# Patient Record
Sex: Female | Born: 1960 | Race: White | Hispanic: No | Smoking: Former smoker
Health system: Southern US, Community
[De-identification: ages and names within clinical notes are randomized; demographics above are authoritative.]

## PROBLEM LIST (undated history)

## (undated) DIAGNOSIS — G8929 Other chronic pain: Secondary | ICD-10-CM

## (undated) DIAGNOSIS — F419 Anxiety disorder, unspecified: Secondary | ICD-10-CM

## (undated) DIAGNOSIS — M549 Dorsalgia, unspecified: Secondary | ICD-10-CM

## (undated) DIAGNOSIS — N179 Acute kidney failure, unspecified: Secondary | ICD-10-CM

## (undated) DIAGNOSIS — F1110 Opioid abuse, uncomplicated: Secondary | ICD-10-CM

## (undated) DIAGNOSIS — F329 Major depressive disorder, single episode, unspecified: Secondary | ICD-10-CM

## (undated) DIAGNOSIS — F32A Depression, unspecified: Secondary | ICD-10-CM

## (undated) DIAGNOSIS — M546 Pain in thoracic spine: Secondary | ICD-10-CM

## (undated) DIAGNOSIS — F319 Bipolar disorder, unspecified: Secondary | ICD-10-CM

## (undated) DIAGNOSIS — M5020 Other cervical disc displacement, unspecified cervical region: Secondary | ICD-10-CM

## (undated) DIAGNOSIS — A419 Sepsis, unspecified organism: Secondary | ICD-10-CM

## (undated) DIAGNOSIS — F1010 Alcohol abuse, uncomplicated: Secondary | ICD-10-CM

## (undated) HISTORY — PX: HEMORRHOID SURGERY: SHX153

## (undated) HISTORY — PX: TUBAL LIGATION: SHX77

## (undated) HISTORY — PX: LAPAROSCOPIC CHOLECYSTECTOMY: SUR755

## (undated) HISTORY — PX: ABDOMINAL HYSTERECTOMY: SHX81

## (undated) HISTORY — PX: TONSILLECTOMY: SUR1361

---

## 2013-09-25 ENCOUNTER — Encounter (HOSPITAL_BASED_OUTPATIENT_CLINIC_OR_DEPARTMENT_OTHER): Payer: Self-pay | Admitting: Emergency Medicine

## 2013-09-25 ENCOUNTER — Emergency Department (HOSPITAL_BASED_OUTPATIENT_CLINIC_OR_DEPARTMENT_OTHER)
Admission: EM | Admit: 2013-09-25 | Discharge: 2013-09-25 | Disposition: A | Discharge status: Home / Self Care | Payer: BC Managed Care – PPO | Attending: Emergency Medicine | Admitting: Emergency Medicine

## 2013-09-25 DIAGNOSIS — Z792 Long term (current) use of antibiotics: Secondary | ICD-10-CM | POA: Insufficient documentation

## 2013-09-25 DIAGNOSIS — F10920 Alcohol use, unspecified with intoxication, uncomplicated: Secondary | ICD-10-CM

## 2013-09-25 DIAGNOSIS — F17200 Nicotine dependence, unspecified, uncomplicated: Secondary | ICD-10-CM | POA: Insufficient documentation

## 2013-09-25 DIAGNOSIS — F1010 Alcohol abuse, uncomplicated: Secondary | ICD-10-CM | POA: Insufficient documentation

## 2013-09-25 DIAGNOSIS — IMO0002 Reserved for concepts with insufficient information to code with codable children: Secondary | ICD-10-CM | POA: Insufficient documentation

## 2013-09-25 LAB — RAPID URINE DRUG SCREEN, HOSP PERFORMED
Amphetamines: NOT DETECTED
Barbiturates: NOT DETECTED
Benzodiazepines: NOT DETECTED
Cocaine: NOT DETECTED
Opiates: NOT DETECTED
TETRAHYDROCANNABINOL: NOT DETECTED

## 2013-09-25 LAB — ETHANOL: ALCOHOL ETHYL (B): 231 mg/dL — AB (ref 0–11)

## 2013-09-25 NOTE — ED Notes (Signed)
Daughter states pt is slurring her speech-pt admits to ETOH use today-pt A/O-NAD

## 2013-09-25 NOTE — ED Notes (Signed)
Pt reports recovering ETOH and substance abuse relapsed x 3 weeks daughter with pt

## 2013-09-25 NOTE — Discharge Instructions (Signed)
Alcohol Intoxication °Alcohol intoxication occurs when you drink enough alcohol that it affects your ability to function. It can be mild or very severe. Drinking a lot of alcohol in a short time is called binge drinking. This can be very harmful. Drinking alcohol can also be more dangerous if you are taking medicines or other drugs. Some of the effects caused by alcohol may include: °· Loss of coordination. °· Changes in mood and behavior. °· Unclear thinking. °· Trouble talking (slurred speech). °· Throwing up (vomiting). °· Confusion. °· Slowed breathing. °· Twitching and shaking (seizures). °· Loss of consciousness. °HOME CARE °· Do not drive after drinking alcohol. °· Drink enough water and fluids to keep your pee (urine) clear or pale yellow. Avoid caffeine. °· Only take medicine as told by your doctor. °GET HELP IF: °· You throw up (vomit) many times. °· You do not feel better after a few days. °· You frequently have alcohol intoxication. Your doctor can help decide if you should see a substance use treatment counselor. °GET HELP RIGHT AWAY IF: °· You become shaky when you stop drinking. °· You have twitching and shaking. °· You throw up blood. It may look bright red or like coffee grounds. °· You notice blood in your poop (bowel movements). °· You become lightheaded or pass out (faint). °MAKE SURE YOU:  °· Understand these instructions. °· Will watch your condition. °· Will get help right away if you are not doing well or get worse. °Document Released: 08/03/2007 Document Revised: 10/17/2012 Document Reviewed: 07/20/2012 °[employer]® Patient Information ©2015 [employer]. This information is not intended to replace advice given to you by your health care provider. Make sure you discuss any questions you have with your health care provider. ° °

## 2013-09-25 NOTE — ED Provider Notes (Signed)
CSN: [id number]     Arrival date & time 09/25/13  2042 History   First MD Initiated Contact with Patient 09/25/13 2110     This chart was scribed for Junius ArgyleForrest S Mariellen Blaney, MD by Arlan OrganAshley Leger, ED Scribe. This patient was seen in room MH05/MH05 and the patient's care was started 9:16 PM.   Chief Complaint  Patient presents with  . Slurred speech    The history is provided by the patient and a relative. No language interpreter was used.    HPI Comments: Heather Hess is a 53 y.o. female with a PMHx of depression who presents to the Emergency Department for a ETOH and drug screen today. She admits to consuming about a half a pint of vodka today. She denies any illicit drug use. States "my life has been so bad in the last 2 weeks". States "I have been threatened to go to jail". 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 States she was taking about 8 pills daily at that time. She states alcohol abuse started shortly after her Father was killed. She is currently on Cymbalta for clinical depression. At this time she denies any fever, chills, SOB, or CP. Pt has been living with her daughter since October 2014. No known allergies to medications. No other concerns this visit.  History reviewed. No pertinent past medical history. History reviewed. No pertinent past surgical history. No family history on file. History  Substance Use Topics  . Smoking status: Current Every Day Smoker  . Smokeless tobacco: Not on file  . Alcohol Use: Yes   OB History   Grav Para Term Preterm Abortions TAB SAB Ect Mult Living                 Review of Systems  Constitutional: Negative for fever and chills.  HENT: Negative for rhinorrhea and sore throat.   Eyes: Negative for visual disturbance.  Respiratory: Negative for cough and shortness of breath.   Cardiovascular: Negative for chest pain and leg swelling.  Gastrointestinal: Negative for nausea, vomiting,  abdominal pain and diarrhea.  Genitourinary: Negative for dysuria.  Musculoskeletal: Negative for back pain and neck pain.  Skin: Negative for rash.  Neurological: Negative for dizziness, light-headedness and headaches.  Hematological: Does not bruise/bleed easily.  Psychiatric/Behavioral: Negative for confusion.      Allergies  Review of patient's allergies indicates no known allergies.  Home Medications   Prior to Admission medications   Medication Sig Start Date End Date Taking? Authorizing Provider  Azithromycin (ZITHROMAX PO) Take by mouth.   Yes Historical Provider, MD  fluticasone (FLONASE) 50 MCG/ACT nasal spray Place into both nostrils daily.   Yes Historical Provider, MD   Triage Vitals: BP 116/68  Pulse 98  Temp(Src) 98.3 F (36.8 C) (Oral)  Resp 18  Ht 5\' 8"  (1.727 m)  Wt 150 lb (68.04 kg)  BMI 22.81 kg/m2  SpO2 98%   Physical Exam  Nursing note and vitals reviewed. Constitutional: She is oriented to person, place, and time. She appears well-developed and well-nourished.  Alert and oriented x 3  HENT:  Head: Normocephalic and atraumatic.  Mouth/Throat: Oropharynx is clear and moist.  Eyes: Conjunctivae and EOM are normal. Pupils are equal, round, and reactive to light.  Neck: Normal range of motion. Neck supple.  Cardiovascular: Normal rate, regular rhythm and normal heart sounds.   Pulmonary/Chest: Effort normal and breath sounds normal.  Abdominal: Soft. Bowel sounds are  normal.  Musculoskeletal: Normal range of motion.  Neurological: She is alert and oriented to person, place, and time.  Pt appears mildly intoxicated, but appropriate and following commands.   Skin: Skin is warm and dry.  Psychiatric: She has a normal mood and affect. Her behavior is normal.    ED Course  Procedures (including critical care time)  DIAGNOSTIC STUDIES: Oxygen Saturation is 98% on RA, Normal by my interpretation.    COORDINATION OF CARE: 9:25 PM- Will order drug  screen and ethanol. Discussed treatment plan with pt at bedside and pt agreed to plan.     Labs Review Labs Reviewed  ETHANOL - Abnormal; Notable for the following:    Alcohol, Ethyl (B) 231 (*)    All other components within normal limits  URINE RAPID DRUG SCREEN (HOSP PERFORMED)    Imaging Review No results found.   EKG Interpretation None      MDM   Final diagnoses:  Alcohol intoxication, uncomplicated    10:50 PM 53 y.o. female who pw w/ mild etoh intoxication. Her daughter would like etoh and drug screen if she is to continue to let her mother live w/ her. The pt is mildly intoxicated, but a/o x3 and has mental capacity to make decisions. The pt would like the UDS and etoh drawn. She agreed to let me share this with her and her daughter.   10:50 PM: Pt continues to appear well, neg UDS, mildly elev etoh level. The daughter will take the pt home. The patient currently has a Veterinary surgeon and resources for drug/etoh addiction.  I have discussed the diagnosis/risks/treatment options with the patient and family and believe the pt to be eligible for discharge home to follow-up with pcp as needed. We also discussed returning to the ED immediately if new or worsening sx occur. Medications administered to the patient during their visit and any new prescriptions provided to the patient are listed below.  Medications given during this visit Medications - No data to display  New Prescriptions   No medications on file     I personally performed the services described in this documentation, which was scribed in my presence. The recorded information has been reviewed and is accurate.    Junius Argyle, MD 09/26/13 1520

## 2014-07-20 ENCOUNTER — Encounter (HOSPITAL_COMMUNITY): Payer: Self-pay | Admitting: *Deleted

## 2014-07-20 ENCOUNTER — Emergency Department (HOSPITAL_COMMUNITY)
Admission: EM | Admit: 2014-07-20 | Discharge: 2014-07-21 | Disposition: A | Discharge status: Other Acute Inpatient Hospital | Payer: Self-pay | Attending: Emergency Medicine | Admitting: Emergency Medicine

## 2014-07-20 DIAGNOSIS — F329 Major depressive disorder, single episode, unspecified: Secondary | ICD-10-CM | POA: Insufficient documentation

## 2014-07-20 DIAGNOSIS — F32A Depression, unspecified: Secondary | ICD-10-CM

## 2014-07-20 DIAGNOSIS — Z720 Tobacco use: Secondary | ICD-10-CM | POA: Insufficient documentation

## 2014-07-20 DIAGNOSIS — R45851 Suicidal ideations: Secondary | ICD-10-CM

## 2014-07-20 DIAGNOSIS — F10929 Alcohol use, unspecified with intoxication, unspecified: Secondary | ICD-10-CM

## 2014-07-20 DIAGNOSIS — F10129 Alcohol abuse with intoxication, unspecified: Secondary | ICD-10-CM | POA: Insufficient documentation

## 2014-07-20 DIAGNOSIS — Z7951 Long term (current) use of inhaled steroids: Secondary | ICD-10-CM | POA: Insufficient documentation

## 2014-07-20 DIAGNOSIS — Z79899 Other long term (current) drug therapy: Secondary | ICD-10-CM | POA: Insufficient documentation

## 2014-07-20 DIAGNOSIS — F332 Major depressive disorder, recurrent severe without psychotic features: Secondary | ICD-10-CM | POA: Diagnosis present

## 2014-07-20 HISTORY — DX: Other cervical disc displacement, unspecified cervical region: M50.20

## 2014-07-20 HISTORY — DX: Depression, unspecified: F32.A

## 2014-07-20 HISTORY — DX: Major depressive disorder, single episode, unspecified: F32.9

## 2014-07-20 NOTE — ED Notes (Signed)
Pt brought in by [employer] d/t pt's daughter calling 911 because she found a suicide note from pt tonight saying goodbye to family.  Pt reports severe depression and chronic back pain.  Pt is crying and very upset that her daughter had called the cops on her.  Pt continously talks about what has been happening to her.  Pt is very upset.  Reports her daughter had filed a complaint on her which in turned caused her to lose her job.  Reports she was seeing this man whom she "half loved" and had stopped seeing her.  Pt reports loosing her son and her father and has not been able to deal with it.  Pt reports drinking to "edge" daily which has about 12% alcohol each.  Pt is cooperative at this time.

## 2014-07-21 ENCOUNTER — Encounter (HOSPITAL_COMMUNITY): Payer: Self-pay

## 2014-07-21 ENCOUNTER — Inpatient Hospital Stay (HOSPITAL_COMMUNITY)
Admission: AD | Admit: 2014-07-21 | Discharge: 2014-07-26 | DRG: 897 | Disposition: A | Discharge status: Home / Self Care | Payer: Federal, State, Local not specified - Other | Source: Intra-hospital | Attending: Psychiatry | Admitting: Psychiatry

## 2014-07-21 DIAGNOSIS — Z599 Problem related to housing and economic circumstances, unspecified: Secondary | ICD-10-CM | POA: Diagnosis not present

## 2014-07-21 DIAGNOSIS — F314 Bipolar disorder, current episode depressed, severe, without psychotic features: Secondary | ICD-10-CM | POA: Diagnosis present

## 2014-07-21 DIAGNOSIS — F10129 Alcohol abuse with intoxication, unspecified: Secondary | ICD-10-CM

## 2014-07-21 DIAGNOSIS — F332 Major depressive disorder, recurrent severe without psychotic features: Secondary | ICD-10-CM

## 2014-07-21 DIAGNOSIS — F1024 Alcohol dependence with alcohol-induced mood disorder: Secondary | ICD-10-CM | POA: Diagnosis present

## 2014-07-21 DIAGNOSIS — R45851 Suicidal ideations: Secondary | ICD-10-CM | POA: Diagnosis present

## 2014-07-21 DIAGNOSIS — F410 Panic disorder [episodic paroxysmal anxiety] without agoraphobia: Secondary | ICD-10-CM | POA: Diagnosis present

## 2014-07-21 DIAGNOSIS — F411 Generalized anxiety disorder: Secondary | ICD-10-CM | POA: Diagnosis present

## 2014-07-21 DIAGNOSIS — F10929 Alcohol use, unspecified with intoxication, unspecified: Secondary | ICD-10-CM | POA: Insufficient documentation

## 2014-07-21 DIAGNOSIS — G4700 Insomnia, unspecified: Secondary | ICD-10-CM | POA: Diagnosis present

## 2014-07-21 DIAGNOSIS — F17210 Nicotine dependence, cigarettes, uncomplicated: Secondary | ICD-10-CM | POA: Diagnosis present

## 2014-07-21 DIAGNOSIS — Z91410 Personal history of adult physical and sexual abuse: Secondary | ICD-10-CM

## 2014-07-21 HISTORY — DX: Anxiety disorder, unspecified: F41.9

## 2014-07-21 LAB — COMPREHENSIVE METABOLIC PANEL
ALBUMIN: 4.9 g/dL (ref 3.5–5.0)
ALT: 64 U/L — AB (ref 14–54)
AST: 160 U/L — AB (ref 15–41)
Alkaline Phosphatase: 104 U/L (ref 38–126)
Anion gap: 14 (ref 5–15)
BUN: 9 mg/dL (ref 6–20)
CHLORIDE: 102 mmol/L (ref 101–111)
CO2: 22 mmol/L (ref 22–32)
Calcium: 9.2 mg/dL (ref 8.9–10.3)
Creatinine, Ser: 0.69 mg/dL (ref 0.44–1.00)
GFR calc Af Amer: >60 mL/min (ref >60)
GFR calc non Af Amer: >60 mL/min (ref >60)
GLUCOSE: 116 mg/dL — AB (ref 65–99)
POTASSIUM: 4 mmol/L (ref 3.5–5.1)
Sodium: 138 mmol/L (ref 135–145)
Total Bilirubin: 0.4 mg/dL (ref 0.3–1.2)
Total Protein: 8 g/dL (ref 6.5–8.1)

## 2014-07-21 LAB — SALICYLATE LEVEL: Salicylate Lvl: <4 mg/dL (ref 2.8–30.0)

## 2014-07-21 LAB — CBC
HCT: 48.3 % — ABNORMAL HIGH (ref 36.0–46.0)
Hemoglobin: 16.5 g/dL — ABNORMAL HIGH (ref 12.0–15.0)
MCH: 33.5 pg (ref 26.0–34.0)
MCHC: 34.2 g/dL (ref 30.0–36.0)
MCV: 98.2 fL (ref 78.0–100.0)
PLATELETS: 235 10*3/uL (ref 150–400)
RBC: 4.92 MIL/uL (ref 3.87–5.11)
RDW: 12.6 % (ref 11.5–15.5)
WBC: 11 10*3/uL — AB (ref 4.0–10.5)

## 2014-07-21 LAB — RAPID URINE DRUG SCREEN, HOSP PERFORMED
AMPHETAMINES: NOT DETECTED
BARBITURATES: NOT DETECTED
Benzodiazepines: NOT DETECTED
Cocaine: NOT DETECTED
Opiates: NOT DETECTED
Tetrahydrocannabinol: NOT DETECTED

## 2014-07-21 LAB — ETHANOL: ALCOHOL ETHYL (B): 295 mg/dL — AB (ref <5)

## 2014-07-21 LAB — ACETAMINOPHEN LEVEL: Acetaminophen (Tylenol), Serum: <10 ug/mL — ABNORMAL LOW (ref 10–30)

## 2014-07-21 MED ORDER — FOLIC ACID 1 MG PO TABS
1.0000 mg | ORAL_TABLET | Freq: Every day | ORAL | Status: DC
Start: 2014-07-21 — End: 2014-07-21
  Administered 2014-07-21: 1 mg via ORAL
  Filled 2014-07-21: qty 1

## 2014-07-21 MED ORDER — ALUM & MAG HYDROXIDE-SIMETH 200-200-20 MG/5ML PO SUSP: 30.0000 mL | ORAL | Status: DC | PRN

## 2014-07-21 MED ORDER — ZOLPIDEM TARTRATE 5 MG PO TABS: 5.0000 mg | ORAL_TABLET | Freq: Every evening | ORAL | Status: DC | PRN

## 2014-07-21 MED ORDER — ADULT MULTIVITAMIN W/MINERALS CH
1.0000 | ORAL_TABLET | Freq: Every day | ORAL | Status: DC
  Administered 2014-07-22 – 2014-07-26 (×5): 1 via ORAL
  Filled 2014-07-21 (×7): qty 1

## 2014-07-21 MED ORDER — LORAZEPAM 1 MG PO TABS
1.0000 mg | ORAL_TABLET | Freq: Four times a day (QID) | ORAL | Status: AC
  Administered 2014-07-21 – 2014-07-23 (×6): 1 mg via ORAL
  Filled 2014-07-21 (×6): qty 1

## 2014-07-21 MED ORDER — DULOXETINE HCL 60 MG PO CPEP
60.0000 mg | ORAL_CAPSULE | Freq: Every day | ORAL | Status: DC
  Administered 2014-07-21: 60 mg via ORAL
  Filled 2014-07-21: qty 1

## 2014-07-21 MED ORDER — ADULT MULTIVITAMIN W/MINERALS CH
1.0000 | ORAL_TABLET | Freq: Every day | ORAL | Status: DC
  Administered 2014-07-21: 1 via ORAL
  Filled 2014-07-21: qty 1

## 2014-07-21 MED ORDER — TRAZODONE HCL 50 MG PO TABS
50.0000 mg | ORAL_TABLET | Freq: Every evening | ORAL | Status: DC | PRN
  Administered 2014-07-21 – 2014-07-25 (×7): 50 mg via ORAL
  Filled 2014-07-21 (×4): qty 1
  Filled 2014-07-21: qty 28
  Filled 2014-07-21 (×10): qty 1
  Filled 2014-07-21: qty 28

## 2014-07-21 MED ORDER — NICOTINE 21 MG/24HR TD PT24
21.0000 mg | MEDICATED_PATCH | Freq: Every day | TRANSDERMAL | Status: DC
  Administered 2014-07-21: 21 mg via TRANSDERMAL
  Filled 2014-07-21: qty 1

## 2014-07-21 MED ORDER — HYDROXYZINE HCL 25 MG PO TABS
25.0000 mg | ORAL_TABLET | Freq: Four times a day (QID) | ORAL | Status: AC | PRN
  Administered 2014-07-23: 25 mg via ORAL
  Filled 2014-07-21: qty 1

## 2014-07-21 MED ORDER — ONDANSETRON 4 MG PO TBDP: 4.0000 mg | ORAL_TABLET | Freq: Four times a day (QID) | ORAL | Status: AC | PRN

## 2014-07-21 MED ORDER — ACETAMINOPHEN 325 MG PO TABS
650.0000 mg | ORAL_TABLET | Freq: Four times a day (QID) | ORAL | Status: DC | PRN
  Administered 2014-07-21 – 2014-07-26 (×12): 650 mg via ORAL
  Filled 2014-07-21 (×12): qty 2

## 2014-07-21 MED ORDER — LORAZEPAM 1 MG PO TABS: 0.0000 mg | ORAL_TABLET | Freq: Four times a day (QID) | ORAL | Status: DC

## 2014-07-21 MED ORDER — LORAZEPAM 1 MG PO TABS: 1.0000 mg | ORAL_TABLET | Freq: Four times a day (QID) | ORAL | Status: AC | PRN

## 2014-07-21 MED ORDER — ONDANSETRON HCL 4 MG PO TABS: 4.0000 mg | ORAL_TABLET | Freq: Three times a day (TID) | ORAL | Status: DC | PRN

## 2014-07-21 MED ORDER — THIAMINE HCL 100 MG/ML IJ SOLN
100.0000 mg | Freq: Once | INTRAMUSCULAR | Status: AC
  Administered 2014-07-21: 100 mg via INTRAMUSCULAR
  Filled 2014-07-21: qty 2

## 2014-07-21 MED ORDER — LOPERAMIDE HCL 2 MG PO CAPS: 2.0000 mg | ORAL_CAPSULE | ORAL | Status: AC | PRN

## 2014-07-21 MED ORDER — LORAZEPAM 2 MG/ML IJ SOLN: 1.0000 mg | Freq: Four times a day (QID) | INTRAMUSCULAR | Status: DC | PRN

## 2014-07-21 MED ORDER — ACETAMINOPHEN 325 MG PO TABS: 650.0000 mg | ORAL_TABLET | ORAL | Status: DC | PRN

## 2014-07-21 MED ORDER — LORAZEPAM 1 MG PO TABS: 1.0000 mg | ORAL_TABLET | Freq: Four times a day (QID) | ORAL | Status: DC | PRN

## 2014-07-21 MED ORDER — LORAZEPAM 1 MG PO TABS
1.0000 mg | ORAL_TABLET | Freq: Two times a day (BID) | ORAL | Status: AC
  Administered 2014-07-24 – 2014-07-25 (×2): 1 mg via ORAL
  Filled 2014-07-21 (×2): qty 1

## 2014-07-21 MED ORDER — THIAMINE HCL 100 MG/ML IJ SOLN: 100.0000 mg | Freq: Every day | INTRAMUSCULAR | Status: DC

## 2014-07-21 MED ORDER — MAGNESIUM HYDROXIDE 400 MG/5ML PO SUSP: 30.0000 mL | Freq: Every day | ORAL | Status: DC | PRN

## 2014-07-21 MED ORDER — VITAMIN B-1 100 MG PO TABS
100.0000 mg | ORAL_TABLET | Freq: Every day | ORAL | Status: DC
  Administered 2014-07-22 – 2014-07-26 (×5): 100 mg via ORAL
  Filled 2014-07-21 (×7): qty 1

## 2014-07-21 MED ORDER — LORAZEPAM 1 MG PO TABS
1.0000 mg | ORAL_TABLET | Freq: Every day | ORAL | Status: AC
  Administered 2014-07-26: 1 mg via ORAL
  Filled 2014-07-21: qty 1

## 2014-07-21 MED ORDER — DULOXETINE HCL 60 MG PO CPEP
60.0000 mg | ORAL_CAPSULE | Freq: Every day | ORAL | Status: DC
  Administered 2014-07-22 – 2014-07-23 (×2): 60 mg via ORAL
  Filled 2014-07-21 (×3): qty 1

## 2014-07-21 MED ORDER — LORAZEPAM 1 MG PO TABS
0.0000 mg | ORAL_TABLET | Freq: Two times a day (BID) | ORAL | Status: DC
Start: 2014-07-23 — End: 2014-07-21

## 2014-07-21 MED ORDER — VITAMIN B-1 100 MG PO TABS
100.0000 mg | ORAL_TABLET | Freq: Every day | ORAL | Status: DC
  Administered 2014-07-21: 100 mg via ORAL
  Filled 2014-07-21: qty 1

## 2014-07-21 MED ORDER — LORAZEPAM 1 MG PO TABS
1.0000 mg | ORAL_TABLET | Freq: Three times a day (TID) | ORAL | Status: AC
  Administered 2014-07-23 – 2014-07-24 (×3): 1 mg via ORAL
  Filled 2014-07-21 (×3): qty 1

## 2014-07-21 NOTE — ED Notes (Signed)
Patient tearful at times throughout the day.  States she believes her daughter needs to be here and she minimizes the fact that she left a suicide note stating she was drunk and not thinking clear.  Ag[facility] to [facility] or another location if she may get some help after she leaves.  I assured her that she would be set up with a counselor or psychiatrist at time of discharge.

## 2014-07-21 NOTE — Tx Team (Signed)
Initial Interdisciplinary Treatment Plan   PATIENT STRESSORS: Financial difficulties Marital or family conflict Substance abuse   PATIENT STRENGTHS: Ability for insight Average or above average intelligence Capable of independent living Communication skills General fund of knowledge Motivation for treatment/growth   PROBLEM LIST: Problem List/Patient Goals Date to be addressed Date deferred Reason deferred Estimated date of resolution  "substance abuse" 07/21/2014   D/c  "suicidal thoughts" 07/21/2014   D/C  "depression" 07/21/2014   D/c  "anxiety" 07/21/2014   D/c  "panic attacks" 07/21/2014                              DISCHARGE CRITERIA:  Ability to meet basic life and health needs Adequate post-discharge living arrangements Improved stabilization in mood, thinking, and/or behavior Medical problems require only outpatient monitoring Motivation to continue treatment in a less acute level of care Need for constant or close observation no longer present Reduction of life-threatening or endangering symptoms to within safe limits Safe-care adequate arrangements made Verbal commitment to aftercare and medication compliance Withdrawal symptoms are absent or subacute and managed without 24-hour nursing intervention  PRELIMINARY DISCHARGE PLAN: Attend aftercare/continuing care group Attend PHP/IOP Attend 12-step recovery group Outpatient therapy Participate in family therapy Placement in alternative living arrangements  PATIENT/FAMIILY INVOLVEMENT: This treatment plan has been presented to and reviewed with the patient, Jodi MarbleLora Kia.  The patient and family have been given the opportunity to ask questions and make suggestions.  Quintella ReichertKnight, Kory Panjwani Sarah AnnShephard 07/21/2014, 7:08 PM

## 2014-07-21 NOTE — ED Notes (Signed)
Pt. Noted sleeping in room. No complaints or concerns voiced. No distress or abnormal behavior noted. Will continue to monitor with security cameras. Q 15 minute rounds continue. 

## 2014-07-21 NOTE — Progress Notes (Signed)
Patient ID: Heather Hess, female   DOB: November 15, 1960, 54 y.o.   MRN: [id number] Client visible on the unit, in dayroom interacting with staff and peers, hyper verbal. Client reports "I'm glad I'm here so I can get some help" "I had been of my medications" Client reports her father was [place] son [place] 1997, "that nearly destroyed me" Client also reports she has been drinking, but doe not elaborate. A: Writer introduced self to client, provide emotional support.. Client encouraged client to report any concerns, medications reviewed and administered as prescribed. Staff will monitor q1815min for safety. R: Client is safe on the unit.

## 2014-07-21 NOTE — ED Provider Notes (Signed)
CSN: [id number]     Arrival date & time 07/20/14  2306 History   First MD Initiated Contact with Patient 07/20/14 2358     Chief Complaint  Patient presents with  . Suicidal     (Consider location/radiation/quality/duration/timing/severity/associated sxs/prior Treatment) HPI 54 year old female presents to the emergency department brought in by police after her daughter called 911.  Daughter found a suicide note which made her concerned.  Patient reports long-standing depression, worsened by the death of her son at 6116 and her father a few years later.  She reports she had a granddaughter also recently died.  She had a gentleman she was dating who broke up with her 2 days ago.  She reports since that time she has been drinking heavily since this time.  She denies prior history of suicide attempts.  She does report prior hospitals physician for depression.  Patient denies writing a suicide note reports that she was just trying to get her children do pay attention to her.  Suicide note reads and part "please don't judge me, I have been through a lot.  I just can't take it anymore.  I struggled for years.  No one quite gets that I was destroyed 02/01/1996.  No coming back.  Here is my wishes: cremation- No! autopsy you can set me up to rest"  Past Medical History  Diagnosis Date  . Depression   . Herniated cervical disc    History reviewed. No pertinent past surgical history. No family history on file. History  Substance Use Topics  . Smoking status: Current Every Day Smoker  . Smokeless tobacco: Not on file  . Alcohol Use: Yes   OB History    No data available     Review of Systems  Psychiatric/Behavioral: Positive for suicidal ideas, dysphoric mood and agitation.      Allergies  Nsaids  Home Medications   Prior to Admission medications   Medication Sig Start Date End Date Taking? Authorizing Provider  DULoxetine (CYMBALTA) 60 MG capsule Take 1 capsule by mouth daily. 05/15/14   Yes Historical Provider, MD  traMADol (ULTRAM) 50 MG tablet Take 1 tablet by mouth every 8 (eight) hours as needed for moderate pain. pain 07/08/14  Yes Historical Provider, MD  Azithromycin (ZITHROMAX PO) Take by mouth.    Historical Provider, MD  fluticasone (FLONASE) 50 MCG/ACT nasal spray Place into both nostrils daily.    Historical Provider, MD   BP 163/88 mmHg  Pulse 117  Temp(Src) 98.4 F (36.9 C) (Oral)  Resp 16  SpO2 96% Physical Exam  Constitutional: She is oriented to person, place, and time. She appears well-developed and well-nourished. She appears distressed.  HENT:  Head: Normocephalic and atraumatic.  Nose: Nose normal.  Mouth/Throat: Oropharynx is clear and moist.  Eyes: Conjunctivae and EOM are normal. Pupils are equal, round, and reactive to light.  Neck: Normal range of motion. Neck supple. No JVD present. No tracheal deviation present. No thyromegaly present.  Cardiovascular: Normal rate, regular rhythm, normal heart sounds and intact distal pulses.  Exam reveals no gallop and no friction rub.   No murmur heard. Pulmonary/Chest: Effort normal and breath sounds normal. No stridor. No respiratory distress. She has no wheezes. She has no rales. She exhibits no tenderness.  Abdominal: Soft. Bowel sounds are normal. She exhibits no distension and no mass. There is no tenderness. There is no rebound and no guarding.  Musculoskeletal: Normal range of motion. She exhibits no edema or tenderness.  Lymphadenopathy:  She has no cervical adenopathy.  Neurological: She is alert and oriented to person, place, and time. She displays normal reflexes. She exhibits normal muscle tone. Coordination normal.  Skin: Skin is warm and dry. No rash noted. No erythema. No pallor.  Psychiatric: She has a normal mood and affect. Her behavior is normal. Judgment and thought content normal.  Patient is agitated, with rapidly cycling moods, tearful 1 minute, screaming the next.  Patient reports  depression.  Nursing note and vitals reviewed.   ED Course  Procedures (including critical care time) Labs Review Labs Reviewed  ACETAMINOPHEN LEVEL - Abnormal; Notable for the following:    Acetaminophen (Tylenol), Serum <10 (*)    All other components within normal limits  CBC - Abnormal; Notable for the following:    WBC 11.0 (*)    Hemoglobin 16.5 (*)    HCT 48.3 (*)    All other components within normal limits  COMPREHENSIVE METABOLIC PANEL - Abnormal; Notable for the following:    Glucose, Bld 116 (*)    AST 160 (*)    ALT 64 (*)    All other components within normal limits  ETHANOL - Abnormal; Notable for the following:    Alcohol, Ethyl (B) 295 (*)    All other components within normal limits  SALICYLATE LEVEL  URINE RAPID DRUG SCREEN (HOSP PERFORMED)    Imaging Review No results found.   EKG Interpretation None      MDM   Final diagnoses:  Depression  Alcohol intoxication, with unspecified complication  Suicidal thoughts    54 year old female with depression, alcohol abuse, and suicidal ideation.  Patient has been IVC'd.  Holding orders have been written.    Marisa Severin, MD 07/21/14 [phone]
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
## 2014-07-21 NOTE — ED Notes (Signed)
Pt. To [facility] from ED ambulatory without difficulty, to room 38 . Pt. Is alert and oriented, warm and dry in no distress. Pt. Denies SI, HI, and AVH. Pt. is cooperative. Pt. Made aware of security cameras and Q15 minute rounds. Pt. Encouraged to let Nursing staff know of any concerns or needs.

## 2014-07-21 NOTE — ED Notes (Signed)
[facility]  Left the unit ambulatory with [employer].  All belongings handed to the officers.

## 2014-07-21 NOTE — Progress Notes (Signed)
CM spoke with pt who confirms self pay [place] resident with no pcp.  CM discussed and provided written information for self pay pcps, discussed the importance of pcp vs EDP services for f/u care, www.needymeds.org, www.goodrx.com, discounted pharmacies and other [place] resources such as [facility] , P4CC, affordable care act,  [place] med assist, financial assistance, self pay dental services, [place] med assist, DSS and  health department  Reviewed resources for [place] self pay pcps like [facility], [facility] [address], [facility], [facility], local urgent care centers, [facility], [facility], general medical clinics, [facility], [facility] plus others, medication resources, [facility] out patient pharmacies and housing Pt voiced understanding and appreciation of resources provided   Provided P4CC contact information Pt agreed to a referral Cm completed referral Pt to be contact by P4CC clinical liason 

## 2014-07-21 NOTE — BH Assessment (Signed)
Assessment completed. Consulted Hulan FessIjeoma Nwaeze, NP who agrees that pt meets inpatient criteria. Dr. Norlene Campbelltter has been informed of the recommendation.
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
## 2014-07-21 NOTE — BHH Counselor (Signed)
[facility]2 Dr. Jama Flavorsobos attending per Berneice Heinrichina Tate, RN, [facility].

## 2014-07-21 NOTE — BH Assessment (Signed)
Binnie RailJoann Glover, [facility], confirms adult unit is currently at capacity. Contacted the following facilities for placement:  BED AVAILABLE, FAXED CLINICAL INFORMATION: [facility] [place], per [facility], per [facility] Penton[facility], per Nicholos JohnsKathleen  AT CAPACITY: [facility], per [facility], per [facility] Boxer[facility], per [facility], per [facility] per [facility], per [facility], per [facility], per [facility], per [facility], per Gracy BruinsKimberly [facility], per [facility], per [facility], per [facility], per Kennieth RadSheila Brynn Marr, per [facility], per [facility], per [facility], per Jonny RuizJohn  NO RESPONSE: [facility]   [address] Ellis San Carlos ParkWarrick Jr, [facility], [facility], [facility] Triage Specialist [phone]
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
## 2014-07-22 ENCOUNTER — Encounter (HOSPITAL_COMMUNITY): Payer: Self-pay | Admitting: Psychiatry

## 2014-07-22 DIAGNOSIS — F1024 Alcohol dependence with alcohol-induced mood disorder: Secondary | ICD-10-CM | POA: Diagnosis present

## 2014-07-22 LAB — LIPID PANEL
Cholesterol: 265 mg/dL — ABNORMAL HIGH (ref 0–200)
HDL: 74 mg/dL (ref >40)
LDL CALC: 150 mg/dL — AB (ref 0–99)
TRIGLYCERIDES: 207 mg/dL — AB (ref <150)
Total CHOL/HDL Ratio: 3.6 RATIO
VLDL: 41 mg/dL — AB (ref 0–40)

## 2014-07-22 LAB — TSH: TSH: 5.119 u[IU]/mL — ABNORMAL HIGH (ref 0.350–4.500)

## 2014-07-22 MED ORDER — NICOTINE 21 MG/24HR TD PT24
21.0000 mg | MEDICATED_PATCH | Freq: Every day | TRANSDERMAL | Status: DC
  Administered 2014-07-22 – 2014-07-26 (×5): 21 mg via TRANSDERMAL
  Filled 2014-07-22: qty 1
  Filled 2014-07-22: qty 14
  Filled 2014-07-22 (×6): qty 1

## 2014-07-22 NOTE — Progress Notes (Signed)
Patient ID: Heather Hess, female   DOB: Dec 12, 1960, 54 y.o.   MRN: [id number]  Pt currently presents with a flat affect and depressed/anxious behavior. Pt has crying spells throughout the day. One was brought on by a group about grief, pt states "I've just been through so much loss, I can't stand it."  Per self inventory, pt rates depression at a 4, hopelessness 4 and anxiety 3. Pt's daily goal is to "improve mood, speak with DR about issues and meds" and they intend to do so by "be honest, listen and answer questions." Pt reports good sleep and says "that was the best sleep I've had in a long time, poor concentration and a good appetite.   Pt provided with medications per providers orders. Pt's labs and vitals were monitored throughout the day. Pt supported emotionally and encouraged to express concerns and questions. Pt educated on medications. Pt given a 1:1 during crying spell.   Pt's safety ensured with 15 minute and environmental checks. Pt currently denies SI/HI and A/V hallucinations. Pt verbally agrees to seek staff if SI/HI or A/VH occurs and to consult with staff before acting on these thoughts. Pt speech remains rapid/pressured today. Pt blames others and has a negative outlook on her situation. Pt encouraged to seek out positive support systems and consult with the social worker about her legal concerns.
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
## 2014-07-22 NOTE — Progress Notes (Signed)
Adult Psychoeducational Group Note  Date:  07/22/2014 Time:  0900  Group Topic/Focus:  Orientation:   The focus of this group is to educate the patient on the purpose and policies of crisis stabilization and provide a format to answer questions about their admission.  The group details unit policies and expectations of patients while admitted.  Participation Level:  Active  Participation Quality:  Appropriate  Affect:  Appropriate  Cognitive:  Appropriate  Insight: Appropriate  Engagement in Group:  Engaged  Modes of Intervention:  Orientation  Additional Comments:    Mauriah Mcmillen L 07/22/2014, 10:04 AM

## 2014-07-22 NOTE — Tx Team (Signed)
Interdisciplinary Treatment Plan Update (Adult) Date: 07/22/2014   Time Reviewed: 9:30 AM  Progress in Treatment: Attending groups: Continuing to assess, patient new to milieu. Participating in groups: Continuing to assess, patient new to milieu. Taking medication as prescribed: Yes Tolerating medication: Yes Family/Significant other contact made: Continuing to assess, patient new to milieu. Patient understands diagnosis: Yes Discussing patient identified problems/goals with staff: Yes Medical problems stabilized or resolved: Yes Denies suicidal/homicidal ideation: Yes Issues/concerns per patient self-inventory: Yes Other:  New problem(s) identified: N/A  Discharge Plan or Barriers:  5/24: CSW continuing to assess, patient new to milieu.  Reason for Continuation of Hospitalization:  Depression Anxiety Medication Stabilization   Comments: N/A  Estimated length of stay: 3-5 days  For review of initial/current patient goals, please see plan of care. Patient is a 54 year old female admitted for SI and depression. [place] and has no current outpatient providers. Patient will benefit from crisis stabilization, medication evaluation, group therapy, and psycho education in addition to case management for discharge planning. Patient and CSW reviewed pt's identified goals and treatment plan. Pt verbalized understanding and agreed to treatment plan.   Attendees: Patient:    Family:    Physician: Dr. Jama Flavorsobos; Dr. Dub MikesLugo 07/22/2014 9:30 AM  Nursing: Horton ChinAndrea Thorpe, Kathi SimpersSarah Twyman, RN 07/22/2014 9:30 AM  Clinical Social Worker: Samuella BruinKristin Stefana Lodico,  LCSWA 07/22/2014 9:30 AM  Other: Chad CordialLauren Carter, LCSWA  07/22/2014 9:30 AM  Other: Leisa LenzValerie Enoch, Vesta [facility] Liaison 07/22/2014 9:30 AM  Other: Onnie BoerJennifer Clark, Case Manager 07/22/2014 9:30 AM  Other: Serena ColonelAggie Nwoko, May Augustin, NP 07/22/2014 9:30 AM  Other:    Other:    Other:    Other:    Other:     Scribe for Treatment Team:   Samuella BruinKristin Farren Landa, MSW, LCSWA [phone]

## 2014-07-22 NOTE — Progress Notes (Signed)
The focus of this group is to help patients review their daily goal of treatment and discuss progress on daily workbooks. Pt attended the evening group session and responded to all discussion prompts from the Writer. Pt shared that today was a good day on the unit, the highlight of which was "just being alive and here." Pt shared that yesterday was a difficult day, the end result of several stressful events this past year. Pt then expressed gratitude for being in the hospital and getting help. [name] also commented that she was getting along with her peers on the hallway. Pt reported having had no additional needs from Nursing Staff this evening. Pt's affect was appropriate.

## 2014-07-22 NOTE — [facility] Group Notes (Signed)
[facility] LCSW Group Therapy  07/22/2014   1:15 PM   Type of Therapy:  Group Therapy  Participation Level:  Active  Participation Quality:  Attentive, Sharing and Supportive  Affect:  Depressed and Flat  Cognitive:  Alert and Oriented  Insight:  Developing/Improving and Engaged  Engagement in Therapy:  Developing/Improving and Engaged  Modes of Intervention:  Clarification, Confrontation, Discussion, Education, Exploration, Limit-setting, Orientation, Problem-solving, Rapport Building, Dance movement psychotherapisteality Testing, Socialization and Support  Summary of Progress/Problems: The topic for group therapy was feelings about diagnosis.  Pt actively participated in group discussion on their past and current diagnosis and how they feel towards this.  Pt also identified how society and family members judge them, based on their diagnosis as well as stereotypes and stigmas.  Patient discussed feeling judged by her family for her mental illness. She discussed stereotypes that she has encountered. CSW and other group members provided patient with emotional support and encouragement.  Samuella BruinKristin Shanikqua Zarzycki, MSW, [employer]CSWA Clinical Social Worker [facility] [phone]

## 2014-07-22 NOTE — [facility] Suicide Risk Assessment (Signed)
[facility] INPATIENT:  Family/Significant Other Suicide Prevention Education  Suicide Prevention Education:  Education Completed; Daughter Illene RegulusCameo Signor [phone],  (name of family member/significant other) has been identified by the patient as the family member/significant other with whom the patient will be residing, and identified as the person(s) who will aid the patient in the event of a mental health crisis (suicidal ideations/suicide attempt).  With written consent from the patient, the family member/significant other has been provided the following suicide prevention education, prior to the and/or following the discharge of the patient.  The suicide prevention education provided includes the following:  Suicide risk factors  Suicide prevention and interventions  National Suicide Hotline telephone number  [facility] assessment telephone number  [facility] Emergency Assistance 911  [place] and/or Residential Mobile Crisis Unit telephone number  Request made of family/significant other to:  Remove weapons (e.g., guns, rifles, knives), all items previously/currently identified as safety concern.    Remove drugs/medications (over-the-counter, prescriptions, illicit drugs), all items previously/currently identified as a safety concern.  The family member/significant other verbalizes understanding of the suicide prevention education information provided.  The family member/significant other agrees to remove the items of safety concern listed above.  Simya Tercero, West CarboKristin L 07/22/2014, 5:30 PM
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
## 2014-07-22 NOTE — Plan of Care (Signed)
Problem: Ineffective individual coping Goal: STG:Pt. will utilize relaxation techniques to reduce stress STG: Patient will utilize relaxation techniques to reduce stress levels  Outcome: Not Progressing Pt required nursing interventions in order to maintain control today. 07/22/2014
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
## 2014-07-23 LAB — T4, FREE: Free T4: 0.6 ng/dL — ABNORMAL LOW (ref 0.61–1.12)

## 2014-07-23 LAB — HEMOGLOBIN A1C
HEMOGLOBIN A1C: 5.5 % (ref 4.8–5.6)
Mean Plasma Glucose: 111 mg/dL

## 2014-07-23 MED ORDER — DULOXETINE HCL 60 MG PO CPEP
60.0000 mg | ORAL_CAPSULE | Freq: Two times a day (BID) | ORAL | Status: DC
  Administered 2014-07-23 – 2014-07-26 (×6): 60 mg via ORAL
  Filled 2014-07-23: qty 1
  Filled 2014-07-23: qty 28
  Filled 2014-07-23 (×3): qty 1
  Filled 2014-07-23: qty 28
  Filled 2014-07-23 (×5): qty 1

## 2014-07-23 NOTE — [facility] Group Notes (Signed)
[facility]  Emotional Regulation  07/23/2014   2:50 PM  Type of Therapy:  Group Therapy  Participation Level:  Did Not Attend   Wynn BankerHodnett, Tamsin Nader Hairston 07/23/2014   2:50 PM

## 2014-07-23 NOTE — Progress Notes (Signed)
Patient ID: Harumi Yamin, female   DOB: 1961-02-10, 54 y.o.   MRN: [id number] D: Patient reports she has been deep breathing and drawing to keep from blowing up. Pt reports her anxiety tends to creep up towards bedtime. t thought process is organized and behavior is appropriate. Pt denies SI/HI/AVH. Pt attended evening wrap up group and engaged in discussion.  Cooperative with assessment. No acute distressed noted at this time.   A: Met with pt 1:1. Pt encourage to continue deep breathing. Medications administered as prescribed. Writer encouraged pt to discuss feelings. Pt encouraged to come to staff with any questions or concerns.   R: Patient is calm watching TV. Pt is safe and compliant with medication.

## 2014-07-23 NOTE — Progress Notes (Signed)
Recreation Therapy Notes  Date: 07/23/14 Time: 9:30am Location: [address] Group Room  Group Topic: Stress Management  Goal Area(s) Addresses:  Patient will verbalize importance of using healthy stress management.  Patient will identify positive emotions associated with healthy stress management.   Intervention: Progressive Muscle Relaxation Script  Activity :  Patient will listen as LRT leads them through progressive muscle relaxation.  Patients will be lead through each muscle group from head to toe.  Education:  Stress Management, Discharge Planning.   Education Outcome: Acknowledges edcuation/In group clarification offered/Needs additional education  Clinical Observations/Feedback: Patient did not attend.  Chanel Mcadams, LRT/CTRS         Aubriana Ravelo A 07/23/2014 1:45 PM 

## 2014-07-23 NOTE — [facility] Group Notes (Signed)
   [facility] Note  07/23/2014  8:45 AM   Participation Quality: Alert, Appropriate and Oriented  Mood/Affect: Blunted  Depression Rating: 4  Anxiety Rating: 0  Thoughts of Suicide: Pt denies SI/HI  Will you contract for safety? Yes  Current AVH: Pt denies  Plan for Discharge/Comments: Pt attended discharge planning group and actively participated in group. CSW provided pt with today's workbook. Patient plans to return home or to shelter to follow up with outpatient services. CSW continuing to assess for interest in referrals to residential treatment programs.  Transportation Means: Pt reports access to transportation  Supports: No supports mentioned at this time  Samuella BruinKristin Taden Witter, MSW, [employer]CSWA Clinical Social Worker [facility] [phone]
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
## 2014-07-23 NOTE — Progress Notes (Signed)
Patient ID: Heather MarbleLora Hess, female   DOB: 12/09/1960, 54 y.o.   MRN: [id number]   Pt currently presents with a anxious affect and fidgety behavior. Pt speech is still rapid but less pressured than yesterday. Pt reports "I feel better today." Per self inventory, pt rates depression at a 4, hopelessness 2 and anxiety 0. Pt's daily goal is to "focus on recovery plan for now and future" and they intend to do so by "see social worker and doctor." Pt reports good sleep, good concentration and a good appetite. Pt reports "I was taking cymbalta 100mg  during my times of stress and near my sons death day. It helped."   Pt provided with medications per providers orders. Pt's labs and vitals were monitored throughout the day. Pt supported emotionally and encouraged to express concerns and questions. Pt educated on medications. Pt given a 1:1.   Pt's safety ensured with 15 minute and environmental checks. Pt currently denies SI/HI and A/V hallucinations. Pt verbally agrees to seek staff if SI/HI or A/VH occurs and to consult with staff before acting on these thoughts. Pt reports decreased anxiety and satisfaction with current medication regimen.

## 2014-07-24 LAB — T3, FREE: T3, Free: 2.9 pg/mL (ref 2.0–4.4)

## 2014-07-24 MED ORDER — BUSPIRONE HCL 5 MG PO TABS
5.0000 mg | ORAL_TABLET | Freq: Two times a day (BID) | ORAL | Status: DC
  Administered 2014-07-24 – 2014-07-26 (×4): 5 mg via ORAL
  Filled 2014-07-24 (×6): qty 1
  Filled 2014-07-24: qty 28
  Filled 2014-07-24: qty 1
  Filled 2014-07-24: qty 28
  Filled 2014-07-24: qty 1

## 2014-07-24 NOTE — Progress Notes (Signed)
Introduction to pt revealed that she was cordial and friendly, however pt was isolative this afternoon. Minimal interaction with pts and writer noted. Pt did not participate in group.
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
## 2014-07-24 NOTE — Progress Notes (Signed)
Recreation Therapy Notes  Animal-Assisted Activity (AAA) Program Checklist/Progress Notes Patient Eligibility Criteria Checklist & Daily Group note for Rec Tx Intervention  Date: 05.26.16 Time: 2:45pm Location: [address] Dayroom   AAA/T Program Assumption of Risk Form signed by Patient/ or Parent Legal Guardian yes  Patient is free of allergies or sever asthma yes  Patient reports no fear of animals yes  Patient reports no history of cruelty to animalsyes  Patient understands his/her participation is voluntary yes  Patient washes hands before animal contact yes  Patient washes hands after animal contact yes  Education: Hand Washing, Appropriate Animal Interaction   Education Outcome: Acknowledges understanding/In group clarification offered/Needs additional education.   Clinical Observations/Feedback: Patient did not attend group.  Aundrea Horace, LRT/CTRS         Heather Hess 07/24/2014 4:11 PM 

## 2014-07-24 NOTE — Progress Notes (Signed)
Patient ID: Jodi MarbleLora Balkcom, female   DOB: [birth year], 54 y.o.   MRN: [id number]  Adult Psychoeducational Group Note  Date:  07/24/2014 Time: 09:15am  Group Topic/Focus:  Orientation:   The focus of this group is to educate the patient on the purpose and policies of crisis stabilization and provide a format to answer questions about their admission.  The group details unit policies and expectations of patients while admitted. Self Care:   The focus of this group is to help patients understand the importance of self-care in order to improve or restore emotional, physical, spiritual, interpersonal, and financial health.  Participation Level:  Active  Participation Quality:  Attentive  Affect:  Appropriate  Cognitive:  Alert and Oriented  Insight: Improving  Engagement in Group:  Engaged  Modes of Intervention:  Activity, Discussion, Education and Support  Additional Comments: Pt able to identify strengths in self care. Pt identified one healthy aspect of leisure and lifestyle changes to incorporate post discharge.   Aurora Maskwyman, Marice Angelino E 07/24/2014, 10:03 AM
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
## 2014-07-24 NOTE — Plan of Care (Signed)
Problem: Diagnosis: Increased Risk For Suicide Attempt Goal: STG-Patient Will Comply With Medication Regime Outcome: Progressing Pt compliant with PRN and scheduled medication regime.

## 2014-07-24 NOTE — Progress Notes (Signed)
Patient ID: Heather MarbleLora Hess, female   DOB: 04-13-1960, 54 y.o.   MRN: [id number]  Pt currently presents with an appropriate affect and anxious behavior. Per self inventory, pt rates depression at a 3, hopelessness 3 and anxiety 2. Pt's daily goal is to "look at the issues that need to be dealt with, organize them" and they intend to do so by "write everything down." Pt reports good sleep, concentration and appetite.  Pt provided with medications per providers orders. Pt's labs and vitals were monitored throughout the day. Pt supported emotionally and encouraged to express concerns and questions. Pt educated on medications. Pt given 1:1. Pt's safety ensured with 15 minute and environmental checks. Pt currently denies SI/HI and A/V hallucinations. Pt verbally agrees to seek staff if SI/HI or A/VH occurs and to consult with staff before acting on these thoughts. Pt states "I'm feeling a little more anxious today, I'm worried about the charges my daughter filed against me. I'm not sure what's going to happen. I'm also thinking about my ex, I hope he'll take me back."

## 2014-07-24 NOTE — Clinical Social Work Note (Signed)
CSW spoke with patient's daughter who is recommending patient go into a residential treatment program.  CSW followed up with patient.  Patient advised she is not interested in residential treatment.  She is hopeful daughter will drop charges and she will be able to return to her job.

## 2014-07-24 NOTE — Progress Notes (Signed)
Patient ID: Heather Hess, female   DOB: September 01, 1960, 54 y.o.   MRN: [id number] D: Patient tearful talking about a stalker/neighbor that told a guy she is seeing that they are dating. Pt reports she misses him and does not have his phone number to contact him and sad the relationship ended abruptly. Pt denies SI/HI/AVH and pain. Pt attended evening karaoke and was supportive of peers. Cooperative with assessment.   A: Met with pt 1:1. Pt encourage to continue deep breathing. Medications administered as prescribed. Writer encouraged pt to discuss feelings. Pt encouraged to come to staff with any questions or concerns.   R: Patient is safe and compliant with medication.

## 2014-07-24 NOTE — [facility] Group Notes (Signed)
[facility] LCSW Group Therapy  [facility] 1:15 - 2:30 PM  07/24/2014 2:55 PM  Type of Therapy:  Group Therapy  Participation Level: Active  Participation Quality:  Attentive  Affect:  Appropriate  Cognitive:  Appropriate  Insight:  Developing/Improving   Engagement in Therapy:  Developing/Improving   Modes of Intervention:  Discussion, Education, Exploration, Problem-Solving, Rapport Building, Support   Summary of Progress/Problems:   Patient was attentive to speaker from the [employer] [facility] [employer] as he shared his story of dealing with mental health/substance abuse issues and overcoming it by working a recovery program.  Patient expressed interest in their programs and services and received information on their agency.  Patient stated she plans to attend the program at discharge and hopes daughter will attend family/friends.  Wynn BankerHodnett, Marianela Mandrell Hairston 07/24/2014 2:55 PM

## 2014-07-25 MED ORDER — HYDROXYZINE HCL 25 MG PO TABS
25.0000 mg | ORAL_TABLET | Freq: Four times a day (QID) | ORAL | Status: DC | PRN
  Administered 2014-07-25 (×2): 25 mg via ORAL
  Filled 2014-07-25: qty 1
  Filled 2014-07-25: qty 20
  Filled 2014-07-25: qty 1

## 2014-07-25 NOTE — Progress Notes (Signed)
Recreation Therapy Notes  Date: 05.27.16 Time: 9:30am Location: 300 Group Room  Group Topic: Stress Management  Goal Area(s) Addresses:  Patient will verbalize importance of using healthy stress management.  Patient will identify positive emotions associated with healthy stress management.   Intervention: Progressive Muscle Relaxation, Guided Imagery Script  Activity :  Progressive Muscle Relaxation & Guided Imagery.  LRT introduced and educated patients on stress management  Techniques of progressive muscle relaxation and guided imagery.  Scripts were used to deliver both techniques to patients, patients were asked to follow the script read allowed by LRT to engage in practicing both stress management techniques.  Education:  Stress Management, Discharge Planning.   Education Outcome: Acknowledges edcuation/In group clarification offered/Needs additional education  Clinical Observations/Feedback: Patient did not attend group.  Paiden Cavell Lindasy, LRT/CTRS         Abdulkadir Emmanuel A 07/25/2014 4:10 PM 

## 2014-07-25 NOTE — [facility] Group Notes (Signed)
[facility]   07/25/2014 10:26 AM    Participation Quality:  Appropraite  Mood/Affect:  Appropriate  Depression Rating:  0  Anxiety Rating:  2  Thoughts of Suicide:  No  Will you contract for safety?   NA  Current AVH:  No  Plan for Discharge/Comments:  Patient attended discharge planning group and actively participated in group. She reports feeling much better today.  Patient [facility].  Suicide prevention education reviewed and SPE document provided.   Transportation Means: Patient has transportation.   Supports:  Patient has a support system.   Caera Enwright, Joesph JulyQuylle Hairston

## 2014-07-25 NOTE — Tx Team (Signed)
Interdisciplinary Treatment Plan Update (Adult)  Date:  07/25/2014  Time Reviewed:  10:24 AM   Progress in Treatment: Attending groups: Patient is attending groups. Participating in groups:  Patient engages in discussion Taking medication as prescribed:  Patient is taking medications Tolerating medication:  Patient is tolerating medications Family/Significant othe contact made:   Yes, collateral contact with daughter Patient understands diagnosis:Yes, patient understands diagnosis and need for treatment Discussing patient identified problems/goals with staff:  Yes, patient is able to express goals/problems Medical problems stabilized or resolved:  Yes Denies suicidal/homicidal ideation: Yes, patient is denying SI/HI. Issues/concerns per patient self-inventory:   Other:  Discharge Plan or Barriers:  Patient to discharge home and [facility]  Reason for Continuation of Hospitalization: Anxiety Depression Medication stabilization   Comments:  Additional comments:  Patient and CSW reviewed Patient Discharge Process Letter/Patient Involvement Form.  Patient verbalized understanding and signed form.  Patient and CSW also reviewed and identified patient's goals and treatment plan.  Patient verbalized understanding and agreed to plan.  Estimated length of stay: 1 day  New goal(s):  Review of initial/current patient goals per problem list:  Please see plan of careInterdisciplinary Treatment Plan Update (Adult)  Attendees: Patient 07/25/2014 10:24 AM   Family:   07/25/2014 10:24 AM   Physician:  Nehemiah MassedFernando Cobos, MD 07/25/2014 10:24 AM   Nursing:   Michaelyn BarterVivain Kent, RN  07/25/2014 10:24 AM   Clinical Social Worker:  Juline PatchQuylle Liev Brockbank, LCSW 07/25/2014 10:24 AM   Clinical Social Worker:  Belenda CruiseKristin Drinkard, LCSW-A 07/25/2014 10:24 AM   Case Manager:  Onnie BoerJennifer Clark, RN 07/25/2014 10:24 AM   Other:   07/25/2014 10:24 AM  Other:   07/25/2014  10:24 AM   Other:  07/25/2014  10:24 AM   Other:  07/25/2014 10:24 AM   Other:  07/25/2014 10:24 AM   Other:  Chad CordialValerie Enoch, [facility] Transition Team Coordinator 07/25/2014 10:24 AM   Other:   07/25/2014 10:24 AM   Other:  07/25/2014 10:24 AM   Other:   07/25/2014 10:24 AM    Scribe for Treatment Team:   Wynn BankerHodnett, Angelica Wix Hairston, 07/25/2014   10:24 AM

## 2014-07-25 NOTE — Progress Notes (Signed)
D: Patient states she is sleeping fair; appetite good.  She rates her depression as a 2; hopelessness as a 0 and anxiety as a 2.  This afternoon, she became anxious and requested medication.  Her goal today is to "focus on stress and positive reinforcement."  Patient denies any SI/HI/AVH.  Patient continues to ruminate over her neighbor's behavior and the fact that her boyfriend broke up with her by text.  Patient presents with anxious affect; pleasant mood. Patient states she is ready for discharge tomorrow.   A: Continue to monitor medication management and MD orders.  Safety checks completed every 15 minutes per protocol.  Meet 1:1 with patient to discuss concerns and offer encouragement. R: Patient is receptive to staff.

## 2014-07-25 NOTE — [facility] Group Notes (Signed)
[facility] LCSW Group Therapy  Feelings Around Relapse 1:15 -2:30        07/25/2014 3:14 PM   Type of Therapy:  Group Therapy  Participation Level:  Appropriate  Participation Quality:  Appropriate  Affect:  Appropriate  Cognitive:  Attentive Appropriate  Insight:  Developing/Improving  Engagement in Therapy: Developing/Improving  Modes of Intervention:  Discussion Exploration Problem-Solving Supportive  Summary of Progress/Problems:  The topic for today was feelings around relapse.    Patient processed feelings toward relapse and was able to relate to peers. She advised she would start drinking again.  Patient identified coping skills that can be used to prevent a relapse including spending time with family and getting involved with AA.   Wynn BankerHodnett, Israa Caban Hairston 07/25/2014   3:14 PM

## 2014-07-25 NOTE — Progress Notes (Signed)
  [facility] :  Will you be returning to the same living situation after discharge:  Yes,  Patient is returning to her home. At discharge, do you have transportation home?: Yes,  Patient will arrange transportation home. Do you have the ability to pay for your medications: No.  Patient needs assistance with indigent medications   Release of information consent forms completed and in the chart;  Patient's signature needed at discharge.  Patient to Follow up at: Follow-up Information    [facility] 07[facility]/2014.   Why:  [facility] on Tuesday, Jul 29, 2014 or any weekday from 8AM -12 Noon or 1-3PM for counseling   Contact information:   [address] [place], [place]  [address]  [phone]      [facility] On 08/[facility]/2014.   Why:  Please go to [facility] walk in clinic on Friday, August 01, 2014 or any weekday between 8AM - 3PM for medication management   Contact information:   [address] [place], [place]   [id number]  [phone]      Patient denies SI/HI: Patient no longer endorsing SI/HI or other thoughts of self harm.  Safety Planning and Suicide Prevention discussed: .Reviewed with all patients during discharge planning group   Have you used any form of tobacco in the last 30 days? (Cigarettes, Smokeless Tobacco, Cigars, and/or Pipes): Yes  Has patient been referred to the Quitline?: Patient declined referral to Quitline.   Wynn BankerHodnett, Heather Hess 07/25/2014, 3:18 PM

## 2014-07-25 NOTE — Progress Notes (Signed)
Patient ID: Heather Hess, female   DOB: 11/03/1960, 54 y.o.   MRN: [id number] [facility] MD Progress Note  07/25/2014 1:11 PM Heather Hess  MRN:  [id number] Subjective:    Patient is reporting feeling better, but still reporting significant anxiety.  Denies medication side effects.  Objective : I have discussed case with treatment team and have met with patient. Patient has been going to groups and is visible in day room/milieu.  She is reporting improved mood, denies depression, but reports ongoing significant anxiety. She is tolerating medications well and denies medication side effects. ( Cymbalta, and now on Buspar ) . She is completing  Ativan taper and is not currently presenting with any tremors, diaphoresis, or restlessness. We discussed potential medication options to help with alcohol abuse, abstinence efforts, such as campral or naltrexone. Not currently interested- denies cravings, states  She has been sober x 16 years,and  States " I plan to stop, never drink again".  Principal Problem: Alcohol dependence with alcohol-induced mood disorder Diagnosis:   Patient Active Problem List   Diagnosis Date Noted  . Alcohol dependence with alcohol-induced mood disorder [F10.24]   . Bipolar 1 disorder, depressed, severe [F31.4] 07/21/2014  . Alcohol intoxication [F10.129]   . Suicidal thoughts [R45.851]   . Major depressive disorder, recurrent, severe without psychotic features [F33.2]    Total Time spent with patient: 25 minutes   Past Medical History:  Past Medical History  Diagnosis Date  . Depression   . Herniated cervical disc   . Anxiety     Past Surgical History  Procedure Laterality Date  . Abdominal hysterectomy    . Cholecystectomy     Family History: History reviewed. No pertinent family history. Social History:  History  Alcohol Use  . Yes     History  Drug Use No    History   Social History  . Marital Status: Married    Spouse Name: N/A  . Number of  Children: N/A  . Years of Education: N/A   Social History Main Topics  . Smoking status: Current Every Day Smoker -- 0.25 packs/day for 10 years    Types: Cigarettes  . Smokeless tobacco: Not on file  . Alcohol Use: Yes  . Drug Use: No  . Sexual Activity: Yes    Birth Control/ Protection: Condom   Other Topics Concern  . None   Social History Narrative   Additional History:    Sleep:improving   Appetite:  Improving    Assessment:   Musculoskeletal: Strength & Muscle Tone: within normal limits Gait & Station: normal Patient leans: N/A   Psychiatric Specialty Exam: Physical Exam  ROS- denies headache, no diaphoresis, no visual disturbances,  no nausea, no vomiting  Blood pressure 97/54, pulse 89, temperature 97.7 F (36.5 C), temperature source Oral, resp. rate 16, height _0  (1.702 m), weight 142 lb (64.411 kg).Body mass index is 22.24 kg/(m^2).  General Appearance:  Improved grooming   Eye Contact::  Good  Speech:  Normal Rate  Volume:  Normal  Mood:   Euthymic   Affect:   More reactive, remains anxious   Thought Process:  Goal Directed and Linear  Orientation:  Full (Time, Place, and Person)  Thought Content:  no hallucinations, no delusions  Suicidal Thoughts:  No- denies any thoughts of hurting self or anyone else -specifically also denies any thoughts of violence towards the person she says has been  Stalking her   Homicidal Thoughts:  No  Memory:  recent  and remote grossly intact   Judgement:  Other:  improving   Insight:  Present  Psychomotor Activity:  Normal  Concentration:  Good  Recall:  Good  Fund of Knowledge:Good  Language: Good  Akathisia:  Negative  Handed:  Right  AIMS (if indicated):     Assets:  Communication Skills Desire for Improvement Resilience  ADL's:  Improving   Cognition: WNL  Sleep:  Number of Hours: 6.25     Current Medications: Current Facility-Administered Medications  Medication Dose Route Frequency Provider Last  Rate Last Dose  . acetaminophen (TYLENOL) tablet 650 mg  650 mg Oral Q6H PRN Laverle Hobby, PA-C   650 mg at 07/25/14 1696  . alum & mag hydroxide-simeth (MAALOX/MYLANTA) 200-200-20 MG/5ML suspension 30 mL  30 mL Oral Q4H PRN Laverle Hobby, PA-C      . busPIRone (BUSPAR) tablet 5 mg  5 mg Oral BID Jenne Campus, MD   5 mg at 07/25/14 0804  . DULoxetine (CYMBALTA) DR capsule 60 mg  60 mg Oral BID Jenne Campus, MD   60 mg at 07/25/14 0804  . hydrOXYzine (ATARAX/VISTARIL) tablet 25 mg  25 mg Oral Q6H PRN Jenne Campus, MD      . Derrill Memo ON 07/26/2014] LORazepam (ATIVAN) tablet 1 mg  1 mg Oral Daily Spencer E Simon, PA-C      . magnesium hydroxide (MILK OF MAGNESIA) suspension 30 mL  30 mL Oral Daily PRN Laverle Hobby, PA-C      . multivitamin with minerals tablet 1 tablet  1 tablet Oral Daily Laverle Hobby, PA-C   1 tablet at 07/25/14 0804  . nicotine (NICODERM CQ - dosed in mg/24 hours) patch 21 mg  21 mg Transdermal Daily Jenne Campus, MD   21 mg at 07/25/14 0808  . thiamine (VITAMIN B-1) tablet 100 mg  100 mg Oral Daily Laverle Hobby, PA-C   100 mg at 07/25/14 7893  . traZODone (DESYREL) tablet 50 mg  50 mg Oral QHS,MR X 1 Spencer E Simon, PA-C   50 mg at 07/24/14 2248    Lab Results:  No results found for this or any previous visit (from the past 48 hour(s)).  Physical Findings: AIMS: Facial and Oral Movements Muscles of Facial Expression: None, normal Lips and Perioral Area: None, normal Jaw: None, normal Tongue: None, normal,Extremity Movements Upper (arms, wrists, hands, fingers): None, normal Lower (legs, knees, ankles, toes): None, normal, Trunk Movements Neck, shoulders, hips: None, normal, Overall Severity Severity of abnormal movements (highest score from questions above): None, normal Incapacitation due to abnormal movements: None, normal Patient's awareness of abnormal movements (rate only patient's report): No Awareness, Dental Status Current problems  with teeth and/or dentures?: No Does patient usually wear dentures?: Yes  CIWA:  CIWA-Ar Total: 1 COWS:  COWS Total Score: 3   Assessment- at this time patient is not presenting with any symptoms of alcohol withdrawal and vitals are stable. Mood is improved. She is reporting ongoing significant anxiety . So far has tolerated increased Cymbalta and Buspar well.    Treatment Plan Summary: Daily contact with patient to assess and evaluate symptoms and progress in treatment, Medication management, Plan continue inpatient psychiatric management and medications as below  Completing  Ativan Detox protocol to minimize risk of Alcohol Withdrawal. Continue Cymbalta to 60 mgrs BID to address depression, anxiety. Continue  Buspar 5 mgrs BID for Anxiety- side effects discussed  Start Vistaril 25 mgrs Q 6 hours PRN  For anxiety  Continue Trazodone for insomnia. Regarding ongoing treatment for Alcohol Dependence , ot interested in going to a Rehab- encouraged her to go to [place] regularly after discharge    Medical Decision Making:  Established Problem, Stable/Improving (1), Review of Psycho-Social Stressors (1), Review or order clinical lab tests (1) and Review of Medication Regimen & Side Effects (2)     COBOS, FERNANDO 07/25/2014, 1:11 PM

## 2014-07-26 MED ORDER — ATORVASTATIN CALCIUM 10 MG PO TABS: 10.0000 mg | ORAL_TABLET | Freq: Every day | ORAL | Status: DC

## 2014-07-26 MED ORDER — ATORVASTATIN CALCIUM 10 MG PO TABS
10.0000 mg | ORAL_TABLET | Freq: Every day | ORAL | Status: DC
  Filled 2014-07-26: qty 14
  Filled 2014-07-26: qty 1

## 2014-07-26 MED ORDER — NICOTINE 21 MG/24HR TD PT24: 21.0000 mg | MEDICATED_PATCH | Freq: Every day | TRANSDERMAL | Status: DC

## 2014-07-26 MED ORDER — BUSPIRONE HCL 5 MG PO TABS: 5.0000 mg | ORAL_TABLET | Freq: Two times a day (BID) | ORAL | Status: DC

## 2014-07-26 MED ORDER — DULOXETINE HCL 60 MG PO CPEP: 60.0000 mg | ORAL_CAPSULE | Freq: Two times a day (BID) | ORAL | Status: DC

## 2014-07-26 MED ORDER — HYDROXYZINE HCL 25 MG PO TABS
25.0000 mg | ORAL_TABLET | Freq: Four times a day (QID) | ORAL | Status: DC | PRN
Start: 2014-07-26 — End: 2016-02-01

## 2014-07-26 MED ORDER — TRAZODONE HCL 50 MG PO TABS: 50.0000 mg | ORAL_TABLET | Freq: Every evening | ORAL | Status: DC | PRN

## 2014-07-26 NOTE — [facility] Suicide Risk Assessment (Signed)
[facility]   Demographic Factors:  Caucasian  Total Time spent with patient: 30 minutes  Musculoskeletal: Strength & Muscle Tone: within normal limits Gait & Station: normal Patient leans: N/A  Psychiatric Specialty Exam: Physical Exam  Review of Systems  Constitutional: Negative.   HENT: Negative.   Eyes: Negative.   Respiratory: Negative.   Cardiovascular: Negative.   Gastrointestinal: Negative.   Genitourinary: Negative.   Musculoskeletal: Positive for back pain.  Skin: Negative.   Neurological: Negative.   Endo/Heme/Allergies: Negative.   Psychiatric/Behavioral: Positive for depression. The patient is nervous/anxious.     Blood pressure 105/81, pulse 83, temperature 98.9 F (37.2 C), temperature source Oral, resp. rate 18, height 5\' 7"  (1.702 m), weight 64.411 kg (142 lb).Body mass index is 22.24 kg/(m^2).  General Appearance: Fairly Groomed  Patent attorneyye Contact::  Fair  Speech:  Clear and Coherent409  Volume:  Normal  Mood:  Euthymic  Affect:  Appropriate  Thought Process:  Coherent and Goal Directed  Orientation:  Full (Time, Place, and Person)  Thought Content:  plans as she moves on   Suicidal Thoughts:  No  Homicidal Thoughts:  No  Memory:  Immediate;   Fair Recent;   Fair Remote;   Fair  Judgement:  Fair  Insight:  Present  Psychomotor Activity:  Normal  Concentration:  Fair  Recall:  FiservFair  Fund of Knowledge:Fair  Language: Fair  Akathisia:  No  Handed:  Right  AIMS (if indicated):     Assets:  Desire for Improvement Housing Social Support  Sleep:  Number of Hours: 3  Cognition: WNL  ADL's:  Intact   Have you used any form of tobacco in the last 30 days? (Cigarettes, Smokeless Tobacco, Cigars, and/or Pipes): Yes  Has this patient used any form of tobacco in the last 30 days? (Cigarettes, Smokeless Tobacco, Cigars, and/or Pipes) Yes, Prescription not provided because: will use nicotine patches  Mental Status Per Nursing  Assessment::   On Admission:  Suicidal ideation indicated by patient  Current Mental Status by Physician: In full contact with reality. There are no active SI plans or intent. No active S/S of withdrawal. She is willing and motivated to pursue outpatient treatment. She states she feels that her anxiety and her depression are under better control. States she used alcohol to cope. She likes her job, will be living with daughter and grandsons   Loss Factors: NA  Historical Factors: NA  Risk Reduction Factors:   Sense of responsibility to family, Employed and Living with another person, especially a relative  Continued Clinical Symptoms:  Depression:   Comorbid alcohol abuse/dependence Alcohol/Substance Abuse/Dependencies  Cognitive Features That Contribute To Risk:  None    Suicide Risk:  Minimal: No identifiable suicidal ideation.  Patients presenting with no risk factors but with morbid ruminations; may be classified as minimal risk based on the severity of the depressive symptoms  Principal Problem: Alcohol dependence with alcohol-induced mood disorder Discharge Diagnoses:  Patient Active Problem List   Diagnosis Date Noted  . Alcohol dependence with alcohol-induced mood disorder [F10.24]   . Bipolar 1 disorder, depressed, severe [F31.4] 07/21/2014  . Alcohol intoxication [F10.129]   . Suicidal thoughts [R45.851]   . Major depressive disorder, recurrent, severe without psychotic features [F33.2]     Follow-up Information    [facility].   Why:  [facility] on Tuesday, Jul 29, 2014 or any weekday from 8AM -12  Noon or 1-3PM for counseling   Contact information:   [address] [place], [place]  [address]  [phone]      [facility] On 08[facility]2014.   Why:  Please go to [facility]'s walk in clinic on Friday, August 01, 2014 or any weekday between 8AM - 3PM for medication management   Contact  information:   [address] [place], [place]   [address]  [phone]      Plan Of Care/Follow-up recommendations:  Activity:  as tolerated Diet:  regular Follow up as above with [facility] Is patient on multiple antipsychotic therapies at discharge:  No   Has Patient had three or more failed trials of antipsychotic monotherapy by history:  No  Recommended Plan for Multiple Antipsychotic Therapies: NA    Tayler Lassen A 07/26/2014, 9:25 AM

## 2014-07-26 NOTE — Discharge Summary (Signed)
Physician Discharge Summary Note  Patient:  Heather Hess is an 54 y.o., female MRN:  [id number] DOB:  02/05/1961 Patient phone:  [phone] (home)  Patient address:   [address] [place] [address],  Total Time spent with patient: 45 minutes  Date of Admission:  07/21/2014 Date of Discharge: 07/26/2014  Reason for Admission:  substance mood disorder, depression  Principal Problem: Alcohol dependence with alcohol-induced mood disorder Discharge Diagnoses: Patient Active Problem List   Diagnosis Date Noted  . Alcohol dependence with alcohol-induced mood disorder [F10.24]   . Bipolar 1 disorder, depressed, severe [F31.4] 07/21/2014  . Alcohol intoxication [F10.129]   . Suicidal thoughts [R45.851]   . Major depressive disorder, recurrent, severe without psychotic features [F33.2]    Musculoskeletal: Strength & Muscle Tone: within normal limits Gait & Station: normal Patient leans: N/A  Psychiatric Specialty Exam:  SEE SRA Physical Exam  Vitals reviewed. Psychiatric: Her mood appears anxious. She is not agitated. She does not exhibit a depressed mood.    Review of Systems  Constitutional: Negative for fever.  Respiratory: Positive for cough.   Cardiovascular: Negative for chest pain and palpitations.  Skin: Negative for rash.  Neurological: Negative for headaches.  Psychiatric/Behavioral: Negative for depression and hallucinations. The patient is nervous/anxious.   All other systems reviewed and are negative.   Blood pressure 105/81, pulse 83, temperature 98.9 F (37.2 C), temperature source Oral, resp. rate 18, height  (1.702 m), weight 64.411 kg (142 lb).Body mass index is 22.24 kg/(m^2).  Have you used any form of tobacco in the last 30 days? (Cigarettes, Smokeless Tobacco, Cigars, and/or Pipes): Yes  Has this patient used any form of tobacco in the last 30 days? (Cigarettes, Smokeless Tobacco, Cigars, and/or Pipes) Yes, Prescription not  provided because: samples and Rx given  Past Medical History:  Past Medical History  Diagnosis Date  . Depression   . Herniated cervical disc   . Anxiety     Past Surgical History  Procedure Laterality Date  . Abdominal hysterectomy    . Cholecystectomy     Family History: History reviewed. No pertinent family history. Social History:  History  Alcohol Use  . Yes     History  Drug Use No    History   Social History  . Marital Status: Married    Spouse Name: N/A  . Number of Children: N/A  . Years of Education: N/A   Social History Main Topics  . Smoking status: Current Every Day Smoker -- 0.25 packs/day for 10 years    Types: Cigarettes  . Smokeless tobacco: Not on file  . Alcohol Use: Yes  . Drug Use: No  . Sexual Activity: Yes    Birth Control/ Protection: Condom   Other Topics Concern  . None   Social History Narrative  Risk to Self: Is patient at risk for suicide?: No What has been your use of drugs/alcohol within the last 12 months?: Reports history of alcohol abuse- had quit alcohol abuse and recently started drinking heavily on Sunday afternoon- 3 24oz beers Risk to Others:   Prior Inpatient Therapy:   Prior Outpatient Therapy:    Level of Care:  OP  Hospital Course:  Heather Hess is a 54 y.o. female patient admitted with reports of severe alcohol intoxication and suicidal ideation.  She presented to [facility] after her daughter contacted 911. Pt stated "he took my box cutter from me tonight because I was threatening suicide".  Patient wrote a suicide  note with her final wishes.   Heather Hess was admitted for Alcohol dependence with alcohol-induced mood disorder and crisis management.  She was treated discharged with the medications listed below under Medication List.  Medical problems were identified and treated as needed.  Home medications were restarted as appropriate.  Improvement was monitored by observation and Heather Hess daily report of  symptom reduction.  Emotional and mental status was monitored by daily self-inventory reports completed by Heather Hess and clinical staff.         Heather Hess was evaluated by the treatment team for stability and plans for continued recovery upon discharge.  Heather Hess motivation was an integral factor for scheduling further treatment.  Employment, transportation, bed availability, health status, family support, and any pending legal issues were also considered during her hospital stay.  She was offered further treatment options upon discharge including but not limited to Residential, Intensive Outpatient, and Outpatient treatment.  Heather Hess will follow up with the services as listed below under Follow Up Information.     Upon completion of this admission the patient was both mentally and medically stable for discharge denying suicidal/homicidal ideation, auditory/visual/tactile hallucinations, delusional thoughts and paranoia.      Consults:  psychiatry  Significant Diagnostic Studies:  labs: per ED  AST 160 and ALT 64 BAC 295 Cholesterol 265 Trig 207 LDL 150  Discharge Vitals:   Blood pressure 105/81, pulse 83, temperature 98.9 F (37.2 C), temperature source Oral, resp. rate 18, height 5\' 7"  (1.702 m), weight 64.411 kg (142 lb). Body mass index is 22.24 kg/(m^2). Lab Results:   No results found for this or any previous visit (from the past 72 hour(s)).  Physical Findings: AIMS: Facial and Oral Movements Muscles of Facial Expression: None, normal Lips and Perioral Area: None, normal Jaw: None, normal Tongue: None, normal,Extremity Movements Upper (arms, wrists, hands, fingers): None, normal Lower (legs, knees, ankles, toes): None, normal, Trunk Movements Neck, shoulders, hips: None, normal, Overall Severity Severity of abnormal movements (highest score from questions above): None, normal Incapacitation due to abnormal movements: None, normal Patient's awareness  of abnormal movements (rate only patient's report): No Awareness, Dental Status Current problems with teeth and/or dentures?: No Does patient usually wear dentures?: Yes  CIWA:  CIWA-Ar Total: 0 COWS:  COWS Total Score: 3   See Psychiatric Specialty Exam and Suicide Risk Assessment completed by Attending Physician prior to discharge.  Discharge destination:  Home  Is patient on multiple antipsychotic therapies at discharge:  No   Has Patient had three or more failed trials of antipsychotic monotherapy by history:  No  Recommended Plan for Multiple Antipsychotic Therapies: NA    Medication List    STOP taking these medications        traMADol 50 MG tablet  Commonly known as:  ULTRAM      TAKE these medications      Indication   busPIRone 5 MG tablet  Commonly known as:  BUSPAR  Take 1 tablet (5 mg total) by mouth 2 (two) times daily.   Indication:  Anxiety Disorder     DULoxetine 60 MG capsule  Commonly known as:  CYMBALTA  Take 1 capsule (60 mg total) by mouth 2 (two) times daily.   Indication:  Major Depressive Disorder     hydrOXYzine 25 MG tablet  Commonly known as:  ATARAX/VISTARIL  Take 1 tablet (25 mg total) by mouth every 6 (six) hours as needed for anxiety.   Indication:  Anxiety Neurosis  nicotine 21 mg/24hr patch  Commonly known as:  NICODERM CQ - dosed in mg/24 hours  Place 1 patch (21 mg total) onto the skin daily.   Indication:  Nicotine Addiction     traZODone 50 MG tablet  Commonly known as:  DESYREL  Take 1 tablet (50 mg total) by mouth at bedtime and may repeat dose one time if needed.   Indication:  Trouble Sleeping       Follow-up Information    [facility] up with [facility] On 07/29/2014.   Why:  Please go to [facility]' walk in clinic on Tuesday, Jul 29, 2014 or any weekday from 8AM -12 Noon or 1-3PM for counseling   Contact information:   [address] [place], [place]  [address]  [phone]      Follow up with  [facility] On 08/01/2014.   Why:  Please go to [facility] walk in clinic on Friday, August 01, 2014 or any weekday between 8AM - 3PM for medication management   Contact information:   [address] [place], [place]   [address]  [phone]      Follow-up recommendations:  Activity:  as tol, diet as tol  Comments:  1.  Take all your medications as prescribed.              2.  Report any adverse side effects to outpatient provider.                       3.  Patient instructed to not use alcohol or illegal drugs while on prescription medicines.            4.  In the event of worsening symptoms, instructed patient to call 911, the crisis hotline or go to nearest emergency room for evaluation of symptoms.  Total Discharge Time: 30 min  Signed: Velna Hatchet May Agustin AGNP-BC 07/26/2014, 10:03 AM  I personally assessed the patient and formulated the plan Madie Reno A. Dub Mikes, M.D.

## 2014-07-26 NOTE — Progress Notes (Signed)
Adult Psychoeducational Group Note  Date:  07/26/2014 Time:  6:01 AM  Group Topic/Focus:  Wrap-Up Group:   The focus of this group is to help patients review their daily goal of treatment and discuss progress on daily workbooks.  Participation Level:  Active  Participation Quality:  Appropriate  Affect:  Appropriate  Cognitive:  Appropriate  Insight: Appropriate  Engagement in Group:  Engaged  Modes of Intervention:  Discussion  Additional Comments:  Pt stated she wanted to work on her communication for her goal for tomorrow.  Everrett Coombewen, Yona Kosek C 07/26/2014, 6:01 AM

## 2014-07-26 NOTE — Progress Notes (Signed)
Patient ID: Heather Hess, female   DOB: 1961-02-21, 54 y.o.   MRN: [id number] D: Patient pleasant but reports feeling anxious about discharge tomorrow. Pt is observed interacting with peers in the dayroom. Pt denies SI/HI/AVH. Pt attended evening wrap up group and engaged in discussion. Cooperative with assessment.   A: Met with pt 1:1. Pt encourage to continue deep breathing and use skills learned at [facility]. Medications administered as prescribed. Pt encouraged pt to discuss feelings.   R: Patient is safe and compliant with medication.

## 2014-07-26 NOTE — [facility] Group Notes (Signed)
[facility] Group Notes:  (Nursing/MHT/Case Management/Adjunct)  Date:  07/26/2014  Time:  11:25 AM  Type of Therapy:  Psychoeducational Skills  Participation Level:  Did Not Attend  Participation Quality:  Did Not Attend  Affect:  Did Not Attend  Cognitive:  Did Not Attend  Insight:  None  Engagement in Group:  Did Not Attend  Modes of Intervention:  Did Not Attend  Summary of Progress/Problems: Pt did not attend patient self inventory group.   Jacquelyne BalintForrest, Venna Berberich Shanta 07/26/2014, 11:25 AM

## 2014-07-26 NOTE — Progress Notes (Signed)
D: Pt calm and cooperative. Expresses excitement in regards to discharge today. She denies SI/HI. She also does not report A/V hallucinations. She rates her anxiety 4/10, but states this is her normal and she is able to cope with it. "I have to work on communication with my daughter when I go home, she is such a diva."  A: Medication given per MD order. She remains on special checks q 15 mins for safety. Encouragement and counseling given.   R.Safety maintained on the unit.

## 2014-07-26 NOTE — Progress Notes (Signed)
Discharge note: Pt discharged per MD order. Discharge instructions given to patient and reviewed. Verbally expresses knowledge in regards to discharge. RX and sample medications given and reviewed. She denies SI/HI A/V Hallucinations at discharge. Risk of suicide information reviewed with pt. She reports excitement in regards to discharge. "My daughter is picking me up and we are going to spend memorial day at the pool with my grandsons." Items returned from locker, pt verbalizes she received all items and signed for items. Ambulatory out of facility. Daughter in lobby for d/c.

## 2015-01-08 ENCOUNTER — Emergency Department (HOSPITAL_BASED_OUTPATIENT_CLINIC_OR_DEPARTMENT_OTHER): Payer: Self-pay

## 2015-01-08 ENCOUNTER — Inpatient Hospital Stay (HOSPITAL_BASED_OUTPATIENT_CLINIC_OR_DEPARTMENT_OTHER)
Admission: EM | Admit: 2015-01-08 | Discharge: 2015-01-11 | DRG: 872 | Disposition: A | Discharge status: Home / Self Care | Payer: Self-pay | Attending: Internal Medicine | Admitting: Internal Medicine

## 2015-01-08 ENCOUNTER — Encounter (HOSPITAL_BASED_OUTPATIENT_CLINIC_OR_DEPARTMENT_OTHER): Payer: Self-pay | Admitting: *Deleted

## 2015-01-08 DIAGNOSIS — Z79899 Other long term (current) drug therapy: Secondary | ICD-10-CM

## 2015-01-08 DIAGNOSIS — F319 Bipolar disorder, unspecified: Secondary | ICD-10-CM | POA: Diagnosis present

## 2015-01-08 DIAGNOSIS — F419 Anxiety disorder, unspecified: Secondary | ICD-10-CM | POA: Diagnosis present

## 2015-01-08 DIAGNOSIS — A4151 Sepsis due to Escherichia coli [E. coli]: Principal | ICD-10-CM | POA: Diagnosis present

## 2015-01-08 DIAGNOSIS — F1011 Alcohol abuse, in remission: Secondary | ICD-10-CM

## 2015-01-08 DIAGNOSIS — A419 Sepsis, unspecified organism: Secondary | ICD-10-CM

## 2015-01-08 DIAGNOSIS — N179 Acute kidney failure, unspecified: Secondary | ICD-10-CM

## 2015-01-08 DIAGNOSIS — N12 Tubulo-interstitial nephritis, not specified as acute or chronic: Secondary | ICD-10-CM | POA: Diagnosis present

## 2015-01-08 DIAGNOSIS — F17210 Nicotine dependence, cigarettes, uncomplicated: Secondary | ICD-10-CM | POA: Diagnosis present

## 2015-01-08 DIAGNOSIS — F1010 Alcohol abuse, uncomplicated: Secondary | ICD-10-CM

## 2015-01-08 DIAGNOSIS — F418 Other specified anxiety disorders: Secondary | ICD-10-CM | POA: Diagnosis present

## 2015-01-08 DIAGNOSIS — F314 Bipolar disorder, current episode depressed, severe, without psychotic features: Secondary | ICD-10-CM | POA: Diagnosis present

## 2015-01-08 DIAGNOSIS — Z23 Encounter for immunization: Secondary | ICD-10-CM

## 2015-01-08 DIAGNOSIS — Z90710 Acquired absence of both cervix and uterus: Secondary | ICD-10-CM

## 2015-01-08 DIAGNOSIS — Z886 Allergy status to analgesic agent status: Secondary | ICD-10-CM

## 2015-01-08 HISTORY — DX: Alcohol abuse, uncomplicated: F10.10

## 2015-01-08 HISTORY — DX: Other chronic pain: G89.29

## 2015-01-08 HISTORY — DX: Dorsalgia, unspecified: M54.9

## 2015-01-08 HISTORY — DX: Opioid abuse, uncomplicated: F11.10

## 2015-01-08 LAB — CBC WITH DIFFERENTIAL/PLATELET
Basophils Absolute: 0 10*3/uL (ref 0.0–0.1)
Basophils Relative: 0 %
EOS ABS: 0 10*3/uL (ref 0.0–0.7)
Eosinophils Relative: 0 %
HCT: 41.5 % (ref 36.0–46.0)
Hemoglobin: 13.9 g/dL (ref 12.0–15.0)
Lymphocytes Relative: 9 %
Lymphs Abs: 1.5 10*3/uL (ref 0.7–4.0)
MCH: 31.4 pg (ref 26.0–34.0)
MCHC: 33.5 g/dL (ref 30.0–36.0)
MCV: 93.7 fL (ref 78.0–100.0)
Monocytes Absolute: 1.5 10*3/uL — ABNORMAL HIGH (ref 0.1–1.0)
Monocytes Relative: 9 %
NEUTROS ABS: 13.6 10*3/uL — AB (ref 1.7–7.7)
NEUTROS PCT: 82 %
PLATELETS: 151 10*3/uL (ref 150–400)
RBC: 4.43 MIL/uL (ref 3.87–5.11)
RDW: 12 % (ref 11.5–15.5)
WBC: 16.7 10*3/uL — ABNORMAL HIGH (ref 4.0–10.5)

## 2015-01-08 LAB — URINE MICROSCOPIC-ADD ON

## 2015-01-08 LAB — COMPREHENSIVE METABOLIC PANEL
ALK PHOS: 74 U/L (ref 38–126)
ALT: 10 U/L — ABNORMAL LOW (ref 14–54)
ANION GAP: 10 (ref 5–15)
AST: 19 U/L (ref 15–41)
Albumin: 4.1 g/dL (ref 3.5–5.0)
BILIRUBIN TOTAL: 0.6 mg/dL (ref 0.3–1.2)
BUN: 13 mg/dL (ref 6–20)
CO2: 26 mmol/L (ref 22–32)
Calcium: 9 mg/dL (ref 8.9–10.3)
Chloride: 100 mmol/L — ABNORMAL LOW (ref 101–111)
Creatinine, Ser: 1.03 mg/dL — ABNORMAL HIGH (ref 0.44–1.00)
GFR calc non Af Amer: >60 mL/min (ref >60)
Glucose, Bld: 124 mg/dL — ABNORMAL HIGH (ref 65–99)
Potassium: 3.9 mmol/L (ref 3.5–5.1)
Sodium: 136 mmol/L (ref 135–145)
TOTAL PROTEIN: 7.4 g/dL (ref 6.5–8.1)

## 2015-01-08 LAB — URINALYSIS, ROUTINE W REFLEX MICROSCOPIC
BILIRUBIN URINE: NEGATIVE
Glucose, UA: NEGATIVE mg/dL
KETONES UR: NEGATIVE mg/dL
NITRITE: POSITIVE — AB
Protein, ur: 30 mg/dL — AB
Specific Gravity, Urine: 1.008 (ref 1.005–1.030)
Urobilinogen, UA: 1 mg/dL (ref 0.0–1.0)
pH: 7.5 (ref 5.0–8.0)

## 2015-01-08 LAB — I-STAT CG4 LACTIC ACID, ED: LACTIC ACID, VENOUS: 0.98 mmol/L (ref 0.5–2.0)

## 2015-01-08 LAB — PROCALCITONIN: Procalcitonin: 0.42 ng/mL

## 2015-01-08 MED ORDER — DEXTROSE 5 % IV SOLN
1.0000 g | INTRAVENOUS | Status: DC
  Administered 2015-01-08 – 2015-01-10 (×2): 1 g via INTRAVENOUS
  Filled 2015-01-08 (×3): qty 10

## 2015-01-08 MED ORDER — ADULT MULTIVITAMIN W/MINERALS CH
1.0000 | ORAL_TABLET | Freq: Every day | ORAL | Status: DC
  Administered 2015-01-08 – 2015-01-11 (×4): 1 via ORAL
  Filled 2015-01-08 (×4): qty 1

## 2015-01-08 MED ORDER — SODIUM CHLORIDE 0.9 % IV SOLN: INTRAVENOUS | Status: DC

## 2015-01-08 MED ORDER — HYDROMORPHONE HCL 1 MG/ML IJ SOLN
0.5000 mg | INTRAMUSCULAR | Status: DC | PRN
  Administered 2015-01-08: 0.5 mg via INTRAVENOUS
  Filled 2015-01-08: qty 1

## 2015-01-08 MED ORDER — DEXTROSE 5 % IV SOLN: 2.0000 g | Freq: Once | INTRAVENOUS | Status: AC

## 2015-01-08 MED ORDER — LORAZEPAM 2 MG/ML IJ SOLN: 1.0000 mg | Freq: Four times a day (QID) | INTRAMUSCULAR | Status: AC | PRN

## 2015-01-08 MED ORDER — PNEUMOCOCCAL VAC POLYVALENT 25 MCG/0.5ML IJ INJ
0.5000 mL | INJECTION | INTRAMUSCULAR | Status: AC
  Administered 2015-01-09: 0.5 mL via INTRAMUSCULAR
  Filled 2015-01-08: qty 0.5

## 2015-01-08 MED ORDER — NICOTINE 21 MG/24HR TD PT24
21.0000 mg | MEDICATED_PATCH | Freq: Every day | TRANSDERMAL | Status: DC
  Administered 2015-01-08 – 2015-01-11 (×4): 21 mg via TRANSDERMAL
  Filled 2015-01-08 (×4): qty 1

## 2015-01-08 MED ORDER — SODIUM CHLORIDE 0.9 % IV BOLUS (SEPSIS)
500.0000 mL | Freq: Once | INTRAVENOUS | Status: AC
  Administered 2015-01-08: 500 mL via INTRAVENOUS

## 2015-01-08 MED ORDER — BUSPIRONE HCL 10 MG PO TABS
5.0000 mg | ORAL_TABLET | Freq: Two times a day (BID) | ORAL | Status: DC
  Administered 2015-01-08 – 2015-01-11 (×7): 5 mg via ORAL
  Filled 2015-01-08 (×7): qty 1

## 2015-01-08 MED ORDER — ONDANSETRON HCL 4 MG/2ML IJ SOLN
4.0000 mg | Freq: Once | INTRAMUSCULAR | Status: AC
  Administered 2015-01-08: 4 mg via INTRAVENOUS
  Filled 2015-01-08: qty 2

## 2015-01-08 MED ORDER — ONDANSETRON HCL 4 MG PO TABS: 4.0000 mg | ORAL_TABLET | Freq: Four times a day (QID) | ORAL | Status: DC | PRN

## 2015-01-08 MED ORDER — CEFTRIAXONE SODIUM 2 G IJ SOLR
INTRAMUSCULAR | Status: AC
  Administered 2015-01-08: 2000 mg
  Filled 2015-01-08: qty 2

## 2015-01-08 MED ORDER — ONDANSETRON HCL 4 MG/2ML IJ SOLN
4.0000 mg | Freq: Four times a day (QID) | INTRAMUSCULAR | Status: DC | PRN
Start: 2015-01-08 — End: 2015-01-11

## 2015-01-08 MED ORDER — ENOXAPARIN SODIUM 40 MG/0.4ML ~~LOC~~ SOLN
40.0000 mg | SUBCUTANEOUS | Status: DC
  Administered 2015-01-08 – 2015-01-11 (×4): 40 mg via SUBCUTANEOUS
  Filled 2015-01-08 (×5): qty 0.4

## 2015-01-08 MED ORDER — HYDROMORPHONE HCL 1 MG/ML IJ SOLN
1.0000 mg | Freq: Once | INTRAMUSCULAR | Status: AC
  Administered 2015-01-08: 1 mg via INTRAVENOUS
  Filled 2015-01-08: qty 1

## 2015-01-08 MED ORDER — INFLUENZA VAC SPLIT QUAD 0.5 ML IM SUSY
0.5000 mL | PREFILLED_SYRINGE | INTRAMUSCULAR | Status: AC
  Administered 2015-01-09: 0.5 mL via INTRAMUSCULAR

## 2015-01-08 MED ORDER — HYDROCODONE-ACETAMINOPHEN 5-325 MG PO TABS
1.0000 | ORAL_TABLET | ORAL | Status: DC | PRN
  Administered 2015-01-08: 2 via ORAL
  Administered 2015-01-08: 1 via ORAL
  Administered 2015-01-08 – 2015-01-09 (×2): 2 via ORAL
  Administered 2015-01-09: 1 via ORAL
  Administered 2015-01-09 – 2015-01-10 (×5): 2 via ORAL
  Filled 2015-01-08 (×10): qty 2

## 2015-01-08 MED ORDER — SODIUM CHLORIDE 0.9 % IV BOLUS (SEPSIS)
30.0000 mL/kg | Freq: Once | INTRAVENOUS | Status: AC
  Administered 2015-01-08: 2124 mL via INTRAVENOUS

## 2015-01-08 MED ORDER — VITAMIN B-1 100 MG PO TABS
100.0000 mg | ORAL_TABLET | Freq: Every day | ORAL | Status: DC
  Administered 2015-01-08 – 2015-01-11 (×4): 100 mg via ORAL
  Filled 2015-01-08 (×4): qty 1

## 2015-01-08 MED ORDER — SODIUM CHLORIDE 0.9 % IV SOLN
INTRAVENOUS | Status: AC
  Administered 2015-01-08: 06:00:00 via INTRAVENOUS

## 2015-01-08 MED ORDER — FOLIC ACID 1 MG PO TABS
1.0000 mg | ORAL_TABLET | Freq: Every day | ORAL | Status: DC
Start: 2015-01-08 — End: 2015-01-11
  Administered 2015-01-08 – 2015-01-11 (×4): 1 mg via ORAL
  Filled 2015-01-08 (×4): qty 1

## 2015-01-08 MED ORDER — LORAZEPAM 1 MG PO TABS: 1.0000 mg | ORAL_TABLET | Freq: Four times a day (QID) | ORAL | Status: AC | PRN

## 2015-01-08 MED ORDER — TRAZODONE HCL 50 MG PO TABS
50.0000 mg | ORAL_TABLET | Freq: Every evening | ORAL | Status: DC | PRN
  Administered 2015-01-08 – 2015-01-10 (×5): 50 mg via ORAL
  Filled 2015-01-08 (×5): qty 1

## 2015-01-08 MED ORDER — DULOXETINE HCL 60 MG PO CPEP
60.0000 mg | ORAL_CAPSULE | Freq: Two times a day (BID) | ORAL | Status: DC
  Administered 2015-01-08 – 2015-01-11 (×7): 60 mg via ORAL
  Filled 2015-01-08 (×7): qty 1

## 2015-01-08 MED ORDER — HYDROXYZINE HCL 25 MG PO TABS: 25.0000 mg | ORAL_TABLET | Freq: Four times a day (QID) | ORAL | Status: DC | PRN

## 2015-01-08 MED ORDER — THIAMINE HCL 100 MG/ML IJ SOLN: 100.0000 mg | Freq: Every day | INTRAMUSCULAR | Status: DC

## 2015-01-08 MED ORDER — SODIUM CHLORIDE 0.9 % IJ SOLN
3.0000 mL | Freq: Two times a day (BID) | INTRAMUSCULAR | Status: DC
  Administered 2015-01-08 – 2015-01-11 (×7): 3 mL via INTRAVENOUS

## 2015-01-08 NOTE — ED Provider Notes (Addendum)
CSN: [id number]     Arrival date & time 01/08/15  0025 History   First MD Initiated Contact with Patient 01/08/15 0119     Chief Complaint  Patient presents with  . Flank Pain     (Consider location/radiation/quality/duration/timing/severity/associated sxs/prior Treatment) HPI Patient reports some more gradual onset of discomfort yesterday evening. She thought she might have just a bit of a back strain. She was feeling mildly ill and reports subjective fever. Today she reports that the pain became much worse and now she has severe right flank pain. She reports it radiates to her right lower abdomen. She does report nausea but has not had vomiting. No associated cough or shortness of breath. Patient has not specifically had pain burning or urgency with urination. The patient does report she has had problems with opioid abuse and dependence. They're also been problems with alcohol abuse and dependence. The patient reports that she is in recovery and has not had alcohol or opiates for over 5 weeks. Past Medical History  Diagnosis Date  . Depression   . Herniated cervical disc   . Anxiety   . Chronic back pain   . Alcohol abuse   . Opiate abuse, episodic    Past Surgical History  Procedure Laterality Date  . Abdominal hysterectomy    . Cholecystectomy     History reviewed. No pertinent family history. Social History  Substance Use Topics  . Smoking status: Current Every Day Smoker -- 0.25 packs/day for 10 years    Types: Cigarettes  . Smokeless tobacco: None  . Alcohol Use: Yes   OB History    No data available     Review of Systems 10 Systems reviewed and are negative for acute change except as noted in the HPI.    Allergies  Nsaids  Home Medications   Prior to Admission medications   Medication Sig Start Date End Date Taking? Authorizing Provider  busPIRone (BUSPAR) 5 MG tablet Take 1 tablet (5 mg total) by mouth 2 (two) times daily. 07/26/14   Adonis Brook, NP   DULoxetine (CYMBALTA) 60 MG capsule Take 1 capsule (60 mg total) by mouth 2 (two) times daily. 07/26/14   Adonis Brook, NP  hydrOXYzine (ATARAX/VISTARIL) 25 MG tablet Take 1 tablet (25 mg total) by mouth every 6 (six) hours as needed for anxiety. 07/26/14   Adonis Brook, NP  nicotine (NICODERM CQ - DOSED IN MG/24 HOURS) 21 mg/24hr patch Place 1 patch (21 mg total) onto the skin daily. 07/26/14   Adonis Brook, NP  traZODone (DESYREL) 50 MG tablet Take 1 tablet (50 mg total) by mouth at bedtime and may repeat dose one time if needed. 07/26/14   Adonis Brook, NP   BP 77/45 mmHg  Pulse 93  Temp(Src) 99.9 F (37.7 C)  Resp 16  Ht  (1.727 m)  Wt 156 lb (70.761 kg)  BMI 23.73 kg/m2  SpO2 100%  LMP  Physical Exam  Constitutional: She is oriented to person, place, and time. She appears well-developed and well-nourished.  Patient does appear to be in significant pain. She is alert and appropriate.  HENT:  Head: Normocephalic and atraumatic.  Nose: Nose normal.  Mouth/Throat: Oropharynx is clear and moist.  Eyes: EOM are normal. Pupils are equal, round, and reactive to light.  Cardiovascular: Normal rate, regular rhythm, normal heart sounds and intact distal pulses.   Pulmonary/Chest: Effort normal and breath sounds normal.  Abdominal: Soft. Bowel sounds are normal. She exhibits no distension. There  is tenderness.  Right mid abdominal tenderness far lateral. The for right CVA tenderness to percussion.  Musculoskeletal: Normal range of motion. She exhibits no edema or tenderness.  Neurological: She is alert and oriented to person, place, and time. She exhibits normal muscle tone. Coordination normal.  Skin:  Mildly diaphoretic  Psychiatric: She has a normal mood and affect.    ED Course  Procedures (including critical care time) Labs Review Labs Reviewed  URINALYSIS, ROUTINE W REFLEX MICROSCOPIC (NOT AT [facility]) - Abnormal; Notable for the following:    APPearance CLOUDY (*)     Hgb urine dipstick LARGE (*)    Protein, ur 30 (*)    Nitrite POSITIVE (*)    Leukocytes, UA LARGE (*)    All other components within normal limits  URINE MICROSCOPIC-ADD ON - Abnormal; Notable for the following:    Bacteria, UA MANY (*)    All other components within normal limits  COMPREHENSIVE METABOLIC PANEL - Abnormal; Notable for the following:    Chloride 100 (*)    Glucose, Bld 124 (*)    Creatinine, Ser 1.03 (*)    ALT 10 (*)    All other components within normal limits  CBC WITH DIFFERENTIAL/PLATELET - Abnormal; Notable for the following:    WBC 16.7 (*)    Neutro Abs 13.6 (*)    Monocytes Absolute 1.5 (*)    All other components within normal limits  URINE CULTURE  CULTURE, BLOOD (ROUTINE X 2)  CULTURE, BLOOD (ROUTINE X 2)  LACTIC ACID, PLASMA  LACTIC ACID, PLASMA  I-STAT CG4 LACTIC ACID, ED    Imaging Review Ct Renal Stone Study  01/08/2015  CLINICAL DATA:  Right flank and lower quadrant pain for 10 hours. Nausea. Hematuria. EXAM: CT ABDOMEN AND PELVIS WITHOUT CONTRAST TECHNIQUE: Multidetector CT imaging of the abdomen and pelvis was performed following the standard protocol without IV contrast. COMPARISON:  None. FINDINGS: Lower chest:  The included lung bases are clear. Liver: No evidence focal lesion allowing for lack contrast. Hepatobiliary: Clips in the gallbladder fossa postcholecystectomy. No biliary dilatation. Pancreas: Normal. Spleen: Normal. Adrenal glands: No nodule. Kidneys: There is perinephric stranding about the right kidney with prominence of the right renal pelvis and proximal ureter. Calcifications in the region of the right mid ureter, image 51 and 53, appear to be related to phleboliths rather than ureteric stones. Left-sided phleboliths are noted similar location. Unenhanced left kidney appears normal. No left urolithiasis. Stomach/Bowel: Stomach physiologically distended. There are no dilated or thickened small bowel loops. Small volume of stool  throughout the colon without colonic wall thickening. The appendix is normal. Vascular/Lymphatic: No retroperitoneal adenopathy. Abdominal aorta is normal in caliber. Mild atherosclerosis without aneurysm. Reproductive: Uterus is surgically absent. No adnexal mass. Ovaries are not discretely visualized. Bladder: Physiologically distended with mild wall thickening involving the dome. No intravesicular air. Other: No free air, free fluid, or intra-abdominal fluid collection. Musculoskeletal: There are no acute or suspicious osseous abnormalities. Hemangioma within L2 vertebral body. IMPRESSION: Right perinephric stranding and mild prominence of the proximal renal collecting system. The appearance favors pyelonephritis over renal obstruction. Calcifications in the region of the mid right ureter likely phleboliths rather than ureteric stones. Electronically Signed   By: Rubye OaksMelanie  Ehinger M.D.   On: 01/08/2015 02:29   I have personally reviewed and evaluated these images and lab results as part of my medical decision-making.   EKG Interpretation None      MDM   Final diagnoses:  Pyelonephritis  Sepsis, due to unspecified organism [facility])   Patient presents with one days worth of symptoms. At this time findings are consistent with pyelonephritis on CT scan with grossly positive urinalysis. The patient reports subjective fever at home. Temperature measured in the emergency department was 99.9. After initiating treatment, the patient did develop diaphoresis and orthostatic blood pressures dropped to a systolic of 70 with position change. Patient is being treated with IV antibiotics and fluid bolusing for suspected sepsis. She will be admitted for ongoing inpatient treatment. EMR indicated history of alcoholism. I did confirm with the patient and her daughter who was in the room that she has not had alcohol for over 5 weeks being in a recovery treatment program. They reported there was no possibility of symptoms  being associated with alcohol withdrawal.    Arby Barrette, MD 01/08/15 1610  Arby Barrette, MD 01/08/15 [phone]

## 2015-01-08 NOTE — ED Notes (Signed)
Pt c/o right flank pain x 4 hrs 

## 2015-01-08 NOTE — Progress Notes (Signed)
ANTIBIOTIC CONSULT NOTE - INITIAL  Pharmacy Consult for Ceftriaxone Indication: Pyelonephritis  Allergies  Allergen Reactions  . Nsaids Hives    Patient Measurements: Height: 5\' 8"  (172.7 cm) Weight: 155 lb 6.4 oz (70.489 kg) (a scale) IBW/kg (Calculated) : 63.9  Vital Signs: Temp: 99.4 F (37.4 C) (11/10 0502) Temp Source: Oral (11/10 0502) BP: 104/62 mmHg (11/10 0502) Pulse Rate: 98 (11/10 0502)  Labs:  Recent Labs  01/08/15 0135  WBC 16.7*  HGB 13.9  PLT 151  CREATININE 1.03*   Estimated Creatinine Clearance: 63.7 mL/min (by C-G formula based on Cr of 1.03).  Medical History: Past Medical History  Diagnosis Date  . Depression   . Herniated cervical disc   . Anxiety   . Chronic back pain   . Alcohol abuse   . Opiate abuse, episodic     Assessment: [name] [facility] with pyelonephritis, WBC is elevated, already received first dose of Ceftriaxone  Plan:  -Ceftriaxone 1g IV q24h -F/U urine culture  Heather Hess, Heather Hess 01/08/2015,5:07 AM

## 2015-01-08 NOTE — Progress Notes (Signed)
[facility] PROGRESS NOTE    Progress Note   Heather Hess ZOX:[id number] DOB: 12/15/1960 DOA: 01-16-15 PCP: Teena Irani, PA-C   Brief Narrative:   Heather Hess is an 54 y.o. female with past medical history of anxiety and depression comes into the ED for right-sided pain fever that started 2 days prior to admission she was found to have pyelonephritis.  Assessment/Plan:   Sepsis (HCC) due to Pyelonephritis: Lactic acid Remain less than 2, she was started empirically on 16-Jan-2015 on Rocephin. Urine cultures and blood cultures are pending. She relates she had previously been on Bactrim one week prior to admission.  Disorder with a history of anxiety, Bipolar 1 disorder, depressed, severe (HCC): Continue Cymbalta, BuSpar, trazodone and hydroxyzine  Acute kidney injury: Baseline creatinine is 0.9 on admission it was 1.3, she was on aggressive fluid hydration, repeat a basic metabolic panel in the morning.  Tobacco abuse: Counseling she was started on a nicotine patch.  As you alcohol abuse: Monitor with serial protocol started on thiamine and folate.    DVT Prophylaxis - Lovenox ordered.  Family Communication: none Disposition Plan: Home when stable. Code Status:     Code Status Orders        Start     Ordered   16-Jan-2015 0509  Full code   Continuous     January 16, 2015 0516        IV Access:    Peripheral IV   Procedures and diagnostic studies:   Ct Renal Stone Study  01/16/15  CLINICAL DATA:  Right flank and lower quadrant pain for 10 hours. Nausea. Hematuria. EXAM: CT ABDOMEN AND PELVIS WITHOUT CONTRAST TECHNIQUE: Multidetector CT imaging of the abdomen and pelvis was performed following the standard protocol without IV contrast. COMPARISON:  None. FINDINGS: Lower chest:  The included lung bases are clear. Liver: No evidence focal lesion allowing for lack contrast. Hepatobiliary: Clips in the gallbladder fossa postcholecystectomy. No biliary  dilatation. Pancreas: Normal. Spleen: Normal. Adrenal glands: No nodule. Kidneys: There is perinephric stranding about the right kidney with prominence of the right renal pelvis and proximal ureter. Calcifications in the region of the right mid ureter, image 51 and 53, appear to be related to phleboliths rather than ureteric stones. Left-sided phleboliths are noted similar location. Unenhanced left kidney appears normal. No left urolithiasis. Stomach/Bowel: Stomach physiologically distended. There are no dilated or thickened small bowel loops. Small volume of stool throughout the colon without colonic wall thickening. The appendix is normal. Vascular/Lymphatic: No retroperitoneal adenopathy. Abdominal aorta is normal in caliber. Mild atherosclerosis without aneurysm. Reproductive: Uterus is surgically absent. No adnexal mass. Ovaries are not discretely visualized. Bladder: Physiologically distended with mild wall thickening involving the dome. No intravesicular air. Other: No free air, free fluid, or intra-abdominal fluid collection. Musculoskeletal: There are no acute or suspicious osseous abnormalities. Hemangioma within L2 vertebral body. IMPRESSION: Right perinephric stranding and mild prominence of the proximal renal collecting system. The appearance favors pyelonephritis over renal obstruction. Calcifications in the region of the mid right ureter likely phleboliths rather than ureteric stones. Electronically Signed   By: Rubye Oaks M.D.   On: 2015-01-16 02:29     Medical Consultants:    None.  Anti-Infectives:   Anti-infectives    Start     Dose/Rate Route Frequency Ordered Stop   01-16-15 2300  cefTRIAXone (ROCEPHIN) 1 g in dextrose 5 % 50 mL IVPB     1 g 100 mL/hr over 30 Minutes Intravenous Every 24 hours January 16, 2015 0506  01/08/15 0147  cefTRIAXone (ROCEPHIN) 2 G injection    Comments:  Waldo Laine   : cabinet override      01/08/15 0147 01/08/15 0152   01/08/15 0130  cefTRIAXone  (ROCEPHIN) 2 g in dextrose 5 % 50 mL IVPB     2 g 100 mL/hr over 30 Minutes Intravenous  Once 01/08/15 0127 01/08/15 0224      Subjective:    Heather Hess she continues to have flank pain  Objective:    Filed Vitals:   01/08/15 0323 01/08/15 0406 01/08/15 0502 01/08/15 0847  BP: 120/71 114/71 104/62 101/67  Pulse: 99 96 98   Temp:  100.7 F (38.2 C) 99.4 F (37.4 C) 99.4 F (37.4 C)  TempSrc:  Oral Oral Oral  Resp: Height:    (1.727 m)   Weight:   70.489 kg (155 lb 6.4 oz)   SpO2: 96% 95% 96%     Intake/Output Summary (Last 24 hours) at 01/08/15 1039 Last data filed at 01/08/15 0838  Gross per 24 hour  Intake    960 ml  Output    800 ml  Net    160 ml   Filed Weights   01/08/15 0030 01/08/15 0502  Weight: 70.761 kg (156 lb) 70.489 kg (155 lb 6.4 oz)    Exam: Gen:  NAD Cardiovascular:  RRR, No M/R/G Chest and lungs:   CTAB Abdomen:  Abdomen soft, NT/ND, + BS, positive CVA tenderness. Extremities:  No C/E/C   Data Reviewed:    Labs: Basic Metabolic Panel:  Recent Labs Lab 01/08/15 0135  NA 136  K 3.9  CL 100*  CO2 26  GLUCOSE 124*  BUN 13  CREATININE 1.03*  CALCIUM 9.0   GFR Estimated Creatinine Clearance: 63.7 mL/min (by C-G formula based on Cr of 1.03). Liver Function Tests:  Recent Labs Lab 01/08/15 0135  AST 19  ALT 10*  ALKPHOS 74  BILITOT 0.6  PROT 7.4  ALBUMIN 4.1   No results for input(s): LIPASE, AMYLASE in the last 168 hours. No results for input(s): AMMONIA in the last 168 hours. Coagulation profile No results for input(s): INR, PROTIME in the last 168 hours.  CBC:  Recent Labs Lab 01/08/15 0135  WBC 16.7*  NEUTROABS 13.6*  HGB 13.9  HCT 41.5  MCV 93.7  PLT 151   Cardiac Enzymes: No results for input(s): CKTOTAL, CKMB, CKMBINDEX, TROPONINI in the last 168 hours. BNP (last 3 results) No results for input(s): PROBNP in the last 8760 hours. CBG: No results for input(s): GLUCAP in the last  168 hours. D-Dimer: No results for input(s): DDIMER in the last 72 hours. Hgb A1c: No results for input(s): HGBA1C in the last 72 hours. Lipid Profile: No results for input(s): CHOL, HDL, LDLCALC, TRIG, CHOLHDL, LDLDIRECT in the last 72 hours. Thyroid function studies: No results for input(s): TSH, T4TOTAL, T3FREE, THYROIDAB in the last 72 hours.  Invalid input(s): FREET3 Anemia work up: No results for input(s): VITAMINB12, FOLATE, FERRITIN, TIBC, IRON, RETICCTPCT in the last 72 hours. Sepsis Labs:  Recent Labs Lab 01/08/15 0135 01/08/15 0147 01/08/15 0605  PROCALCITON  --   --  0.42  WBC 16.7*  --   --   LATICACIDVEN  --  0.98  --    Microbiology Recent Results (from the past 240 hour(s))  Culture, blood (routine x 2)     Status: None (Preliminary result)   Collection Time: 01/08/15  1:35 AM  Result  Value Ref Range Status   Specimen Description BLOOD RIGHT ANTECUBITAL  Final   Special Requests   Final    BOTTLES DRAWN AEROBIC AND ANAEROBIC 5CC Performed at [facility]    Culture PENDING  Incomplete   Report Status PENDING  Incomplete  Culture, blood (routine x 2)     Status: None (Preliminary result)   Collection Time: 01/08/15  1:40 AM  Result Value Ref Range Status   Specimen Description BLOOD RIGHT HAND  Final   Special Requests   Final    BOTTLES DRAWN AEROBIC AND ANAEROBIC 3CC Performed at [facility]    Culture PENDING  Incomplete   Report Status PENDING  Incomplete     Medications:   . sodium chloride   Intravenous STAT  . busPIRone  5 mg Oral BID  . cefTRIAXone (ROCEPHIN)  IV  1 g Intravenous Q24H  . DULoxetine  60 mg Oral BID  . enoxaparin (LOVENOX) injection  40 mg Subcutaneous Q24H  . [START ON 01/09/2015] Influenza vac split quadrivalent PF  0.5 mL Intramuscular Tomorrow-1000  . nicotine  21 mg Transdermal Daily  . [START ON 01/09/2015] pneumococcal 23 valent vaccine  0.5 mL Intramuscular Tomorrow-1000  . sodium chloride  3 mL  Intravenous Q12H  . traZODone  50 mg Oral QHS,MR X 1   Continuous Infusions:   Time spent: 25 min   LOS: 0 days   Marinda ElkFELIZ ORTIZ, Armeda Plumb  [facility] Pager [phone]  *Please refer to [email or web address], password [email or web address] to get updated schedule on who will round on this patient, as hospitalists switch teams weekly. If 7PM-7AM, please contact night-coverage at [email or web address], password [email or web address] for any overnight needs.  01/08/2015, 10:39 AM

## 2015-01-08 NOTE — Progress Notes (Signed)
[facility] transfer page received at 3:21 AM at 01/08/2015. Discussed with Dr. Clarice PolePfeifer regarding transfer of (patient) Heather CroakLaura Hess who presented with symptoms of pyelonephritis with elevated WBC 16 and right sided flank pain. CT scan obtained showing perinephric stranding consistent with pyelonephritis. Patient thought safe for transfer [facility] for need of higher level of care and monitoring. Will admit to a telemetry bed with inpatient status.

## 2015-01-08 NOTE — Progress Notes (Signed)
Visited with to provide listening and spiritual and emotional support.  Patient spoke of how sudden her sickness came upon her. Patient is looking forward to being released in a few day.  I prayed with patient and supplied her with a bible per her request.  Will pass on to unit Chaplain for follow up as needed.   01/08/15 1100  Clinical Encounter Type  Visited With Patient;Health care provider  Visit Type Initial;Spiritual support  Referral From Nurse  Spiritual Encounters  Spiritual Needs [facility] text;Prayer;Emotional  Stress Factors  Patient Stress Factors Health changes  Marnee Spring[place], Arav Bannister, Chaplain,BCC, pager [phone]

## 2015-01-08 NOTE — H&P (Signed)
[facility] History and Physical  Jodi MarbleLora Schaum RUE:[id number] DOB: 1961-02-26 DOA: 01/08/2015  Referring physician: Dr. Clarice PolePfeifer from [facility] PCP: Teena IraniSPENCER,SARA C, [place]-C   Chief Complaint: "Fever and pain on my right side" HPI:  Heather Hess is a 54 year old female with a past medical history for anxiety, depression, chronic back pain, tobacco abuse, alcohol abuse(sober 92 days); who presents as a transfer from [facility] with complaints of fever and right-sided flank pain. Symptoms started 2 days ago initially with subjective fever for which she took Aleve. She reported back pain which she related to a possible back strain. Later that day she noted intermittent chills. She took a hot bath and was able to get some sleep. Yesterday, she stated that she initially felt better and was going along with her day as usual. At approximately 4 PM, she developed acute onset of worsening right sided flank pain that progressively worsened. Pain was constant described as electrical shocks. Symptoms worsened with deep breaths in. She reported having noted associated symptoms including dark foul-smelling urine, nausea, dizziness, and blurred vision. She denied vomiting, dysuria, or frequency. As she was unable to sleep and. The possibility of this being related to her appendix she went to be evaluated.  An initial workup showed a urinalysis which was positive for large leukocyte esterase and nitrates with numerous bacteria. CT scan showed right perinephritic stranding suggestive of pyelonephritis over renal obstruction. Just started on IV Rocephin and was bolused 2 L NS IV fluids prior to transfer   Review of Systems  Constitutional: Positive for fever and chills.  HENT: Negative for ear pain.   Eyes: Positive for blurred vision. Negative for pain.  Respiratory: Negative for hemoptysis and sputum production.   Gastrointestinal: Positive for nausea and abdominal pain. Negative for  vomiting.  Genitourinary: Positive for hematuria and flank pain. Negative for frequency.  Musculoskeletal: Positive for back pain. Negative for falls.  Skin: Negative for itching and rash.  Neurological: Positive for dizziness. Negative for sensory change and headaches.  Endo/Heme/Allergies: Negative for polydipsia. Does not bruise/bleed easily.  Psychiatric/Behavioral: Negative for substance abuse. The patient is nervous/anxious and has insomnia.        Past Medical History  Diagnosis Date  . Depression   . Herniated cervical disc   . Anxiety   . Chronic back pain   . Alcohol abuse   . Opiate abuse, episodic      Past Surgical History  Procedure Laterality Date  . Abdominal hysterectomy    . Cholecystectomy        Social History:  reports that she has been smoking Cigarettes.  She has a 2.5 pack-year smoking history. She does not have any smokeless tobacco history on file. She reports that she drinks alcohol. She reports that she does not use illicit drugs. Where does patient live--home    and with whom if at home? With daughter and 2 grandsons  Can patient participate in ADLs? Yes  Allergies  Allergen Reactions  . Nsaids Hives    History reviewed. No pertinent family history.      Prior to Admission medications   Medication Sig Start Date End Date Taking? Authorizing Provider  busPIRone (BUSPAR) 5 MG tablet Take 1 tablet (5 mg total) by mouth 2 (two) times daily. 07/26/14   Adonis BrookSheila Agustin, NP  DULoxetine (CYMBALTA) 60 MG capsule Take 1 capsule (60 mg total) by mouth 2 (two) times daily. 07/26/14   Adonis BrookSheila Agustin, NP  hydrOXYzine (ATARAX/VISTARIL) 25  MG tablet Take 1 tablet (25 mg total) by mouth every 6 (six) hours as needed for anxiety. 07/26/14   Adonis Brook, NP  nicotine (NICODERM CQ - DOSED IN MG/24 HOURS) 21 mg/24hr patch Place 1 patch (21 mg total) onto the skin daily. 07/26/14   Adonis Brook, NP  traZODone (DESYREL) 50 MG tablet Take 1 tablet (50 mg total)  by mouth at bedtime and may repeat dose one time if needed. 07/26/14   Adonis Brook, NP     Physical Exam: Filed Vitals:   01/08/15 0242 01/08/15 0323 01/08/15 0406 01/08/15 0502  BP: 77/45 120/71 114/71 104/62  Pulse: 93 99 96 98  Temp:   100.7 F (38.2 C) 99.4 F (37.4 C)  TempSrc:   Oral Oral  Resp:  Height:     (1.727 m)  Weight:    70.489 kg (155 lb 6.4 oz)  SpO2: 100% 96% 95% 96%     Constitutional: Vital signs reviewed. Patient is a well-developed and well-nourished in mild distress, but cooperative with exam nontoxic appearing. Head: Normocephalic and atraumatic  Ear: TM normal bilaterally  Mouth: no erythema or exudates, MMM  Eyes: PERRL, EOMI, conjunctivae normal, No scleral icterus.  Neck: Supple, Trachea midline normal ROM, No JVD, mass, thyromegaly, or carotid bruit present.  Cardiovascular: Tachycardic, S1 normal, S2 normal, no MRG, pulses symmetric and intact bilaterally  Pulmonary/Chest: CTAB, no wheezes, rales, or rhonchi  Abdominal: Soft. Tenderness to palpation along through right upper quadrant, non-distended, bowel sounds are normal, no masses, organomegaly, or guarding present.  GU: Positive right-sided CVA tenderness  Musculoskeletal: No joint deformities, erythema, or stiffness, ROM full and no nontender Ext: no edema and no cyanosis, pulses palpable bilaterally (DP and PT)  Hematology: no cervical, inginal, or axillary adenopathy.  Neurological: A&O x3, Strenght is normal and symmetric bilaterally, cranial nerve II-XII are grossly intact, no focal motor deficit, sensory intact to light touch bilaterally.  Skin: Warm, dry and intact. No rash, cyanosis, or clubbing.  Psychiatric: Patient has an anxious mood and affect. speech and behavior is normal. Judgment and thought content normal. Cognition and memory are normal.      Data Review   Micro Results No results found for this or any previous visit (from the past 240  hour(s)).  Radiology Reports Ct Renal Stone Study  01/08/2015  CLINICAL DATA:  Right flank and lower quadrant pain for 10 hours. Nausea. Hematuria. EXAM: CT ABDOMEN AND PELVIS WITHOUT CONTRAST TECHNIQUE: Multidetector CT imaging of the abdomen and pelvis was performed following the standard protocol without IV contrast. COMPARISON:  None. FINDINGS: Lower chest:  The included lung bases are clear. Liver: No evidence focal lesion allowing for lack contrast. Hepatobiliary: Clips in the gallbladder fossa postcholecystectomy. No biliary dilatation. Pancreas: Normal. Spleen: Normal. Adrenal glands: No nodule. Kidneys: There is perinephric stranding about the right kidney with prominence of the right renal pelvis and proximal ureter. Calcifications in the region of the right mid ureter, image 51 and 53, appear to be related to phleboliths rather than ureteric stones. Left-sided phleboliths are noted similar location. Unenhanced left kidney appears normal. No left urolithiasis. Stomach/Bowel: Stomach physiologically distended. There are no dilated or thickened small bowel loops. Small volume of stool throughout the colon without colonic wall thickening. The appendix is normal. Vascular/Lymphatic: No retroperitoneal adenopathy. Abdominal aorta is normal in caliber. Mild atherosclerosis without aneurysm. Reproductive: Uterus is surgically absent. No adnexal mass. Ovaries are not discretely visualized. Bladder: Physiologically distended with  mild wall thickening involving the dome. No intravesicular air. Other: No free air, free fluid, or intra-abdominal fluid collection. Musculoskeletal: There are no acute or suspicious osseous abnormalities. Hemangioma within L2 vertebral body. IMPRESSION: Right perinephric stranding and mild prominence of the proximal renal collecting system. The appearance favors pyelonephritis over renal obstruction. Calcifications in the region of the mid right ureter likely phleboliths rather than  ureteric stones. Electronically Signed   By: Rubye Oaks M.D.   On: 01/08/2015 02:29     CBC  Recent Labs Lab 01/08/15 0135  WBC 16.7*  HGB 13.9  HCT 41.5  PLT 151  MCV 93.7  MCH 31.4  MCHC 33.5  RDW 12.0  LYMPHSABS 1.5  MONOABS 1.5*  EOSABS 0.0  BASOSABS 0.0    Chemistries   Recent Labs Lab 01/08/15 0135  NA 136  K 3.9  CL 100*  CO2 26  GLUCOSE 124*  BUN 13  CREATININE 1.03*  CALCIUM 9.0  AST 19  ALT 10*  ALKPHOS 74  BILITOT 0.6   ------------------------------------------------------------------------------------------------------------------ estimated creatinine clearance is 63.7 mL/min (by C-G formula based on Cr of 1.03). ------------------------------------------------------------------------------------------------------------------ No results for input(s): HGBA1C in the last 72 hours. ------------------------------------------------------------------------------------------------------------------ No results for input(s): CHOL, HDL, LDLCALC, TRIG, CHOLHDL, LDLDIRECT in the last 72 hours. ------------------------------------------------------------------------------------------------------------------ No results for input(s): TSH, T4TOTAL, T3FREE, THYROIDAB in the last 72 hours.  Invalid input(s): FREET3 ------------------------------------------------------------------------------------------------------------------ No results for input(s): VITAMINB12, FOLATE, FERRITIN, TIBC, IRON, RETICCTPCT in the last 72 hours.  Coagulation profile No results for input(s): INR, PROTIME in the last 168 hours.  No results for input(s): DDIMER in the last 72 hours.  Cardiac Enzymes No results for input(s): CKMB, TROPONINI, MYOGLOBIN in the last 168 hours.  Invalid input(s): CK ------------------------------------------------------------------------------------------------------------------ Invalid input(s): POCBNP   CBG: No results for input(s): GLUCAP  in the last 168 hours.     ZOX:[id number]   Assessment/Plan Principal Problem: Sepsis secondary to pyelonephritis: Acute. Patient with fever 100.32F, tachycardia, WBC 16.7,  lactic acid level 0.98. Source identified with +UA and CT scan showing perinephric stranding right kidney suggestive for pyelonephritis over possible renal obstruction. Patient received 2 L of normal saline IV fluids that medicine at [facility]. As well as initial dose of IV Rocephin. -Urine cultures -Empiric antibiotics of Rocephin with pharmacy consult -Pain control with prn opioid medications (note: previous history of opioid addiction so patient may have tolerance) -ekg pending  Mood disorder with history of anxiety and depression. Stable -Continue medications of Cymbalta, BuSpar, trazodone, hydroxyzine  AKI: Baseline Cr 0.69 as of 07/20/2014. Acutely worsen Cr to 1.03 on admission.  -IVF normal saline at 150 mL per hour -Follow up renal function by BMP -Avoid ACEI and NSAIDs   Tobacco abuse: Patient reports smoking approximately 10 cigarettes per day. -Counseled patient on the need of tobacco cessation -Nicotine patch ordered  History of Alcohol abuse: Patient reports being 92 days sober.      Code Status:   full Family Communication: bedside Disposition Plan: admit   Total time spent 55 minutes.Greater than 50% of this time was spent in counseling, explanation of diagnosis, planning of further management, and coordination of care  Clydie Braun [facility] Pager [phone]  If 7PM-7AM, please contact night-coverage [email or web address] Password [email or web address] 01/08/2015, 5:18 AM

## 2015-01-08 NOTE — Progress Notes (Signed)
Utilization review completed. Christ Fullenwider, RN, BSN. 

## 2015-01-09 LAB — BASIC METABOLIC PANEL WITH GFR
Anion gap: 6 (ref 5–15)
BUN: <5 mg/dL — ABNORMAL LOW (ref 6–20)
CO2: 27 mmol/L (ref 22–32)
Calcium: 8.4 mg/dL — ABNORMAL LOW (ref 8.9–10.3)
Chloride: 103 mmol/L (ref 101–111)
Creatinine, Ser: 0.69 mg/dL (ref 0.44–1.00)
GFR calc Af Amer: >60 mL/min (ref >60)
GFR calc non Af Amer: >60 mL/min (ref >60)
Glucose, Bld: 106 mg/dL — ABNORMAL HIGH (ref 65–99)
Potassium: 3.4 mmol/L — ABNORMAL LOW (ref 3.5–5.1)
Sodium: 136 mmol/L (ref 135–145)

## 2015-01-09 LAB — CBC
HCT: 34.1 % — ABNORMAL LOW (ref 36.0–46.0)
Hemoglobin: 11.3 g/dL — ABNORMAL LOW (ref 12.0–15.0)
MCH: 31.4 pg (ref 26.0–34.0)
MCHC: 33.1 g/dL (ref 30.0–36.0)
MCV: 94.7 fL (ref 78.0–100.0)
Platelets: 109 K/uL — ABNORMAL LOW (ref 150–400)
RBC: 3.6 MIL/uL — ABNORMAL LOW (ref 3.87–5.11)
RDW: 12.2 % (ref 11.5–15.5)
WBC: 9.4 K/uL (ref 4.0–10.5)

## 2015-01-09 MED ORDER — POTASSIUM CHLORIDE CRYS ER 20 MEQ PO TBCR
40.0000 meq | EXTENDED_RELEASE_TABLET | Freq: Two times a day (BID) | ORAL | Status: AC
  Administered 2015-01-09 (×2): 40 meq via ORAL
  Filled 2015-01-09 (×2): qty 2

## 2015-01-09 NOTE — Progress Notes (Signed)
Pt a/o, c/o right sided pain but did not request pain meds at the time, pt voiding very well, urine yellow, VSS, pt stable

## 2015-01-09 NOTE — Progress Notes (Signed)
[facility] PROGRESS NOTE    Progress Note   Heather MarbleLora Lazalde ZOX:[id number] DOB: 1961-01-19 DOA: 01/08/2015 PCP: Teena IraniSPENCER,SARA C, PA-C   Brief Narrative:   Heather MarbleLora Heffner is an 54 y.o. female with past medical history of anxiety and depression comes into the ED for right-sided pain fever that started 2 days prior to admission she was found to have pyelonephritis.  Assessment/Plan:   Sepsis (HCC) due to Pyelonephritis: Continue IV Rocephin started empirically on 01/08/2015 on admission. - She has remained afebrile and leukocytosis has resolved Urine cultures and blood cultures are pending. She relates she had previously been on Bactrim one week prior to admission.  Disorder with a history of anxiety, Bipolar 1 disorder, depressed, severe (HCC): Continue Cymbalta, BuSpar, trazodone and hydroxyzine  Acute kidney injury: Baseline creatinine is 0.9. Likely prerenal in the setting of pyelonephritis resolved with IV hydration.  Tobacco abuse: Counseling she was started on a nicotine patch.  As you alcohol abuse: Monitor with serial protocol started on thiamine and folate.    DVT Prophylaxis - Lovenox ordered.  Family Communication: none Disposition Plan: Home when stable. Code Status:     Code Status Orders        Start     Ordered   01/08/15 0509  Full code   Continuous     01/08/15 0516        IV Access:    Peripheral IV   Procedures and diagnostic studies:   Ct Renal Stone Study  01/08/2015  CLINICAL DATA:  Right flank and lower quadrant pain for 10 hours. Nausea. Hematuria. EXAM: CT ABDOMEN AND PELVIS WITHOUT CONTRAST TECHNIQUE: Multidetector CT imaging of the abdomen and pelvis was performed following the standard protocol without IV contrast. COMPARISON:  None. FINDINGS: Lower chest:  The included lung bases are clear. Liver: No evidence focal lesion allowing for lack contrast. Hepatobiliary: Clips in the gallbladder fossa postcholecystectomy. No  biliary dilatation. Pancreas: Normal. Spleen: Normal. Adrenal glands: No nodule. Kidneys: There is perinephric stranding about the right kidney with prominence of the right renal pelvis and proximal ureter. Calcifications in the region of the right mid ureter, image 51 and 53, appear to be related to phleboliths rather than ureteric stones. Left-sided phleboliths are noted similar location. Unenhanced left kidney appears normal. No left urolithiasis. Stomach/Bowel: Stomach physiologically distended. There are no dilated or thickened small bowel loops. Small volume of stool throughout the colon without colonic wall thickening. The appendix is normal. Vascular/Lymphatic: No retroperitoneal adenopathy. Abdominal aorta is normal in caliber. Mild atherosclerosis without aneurysm. Reproductive: Uterus is surgically absent. No adnexal mass. Ovaries are not discretely visualized. Bladder: Physiologically distended with mild wall thickening involving the dome. No intravesicular air. Other: No free air, free fluid, or intra-abdominal fluid collection. Musculoskeletal: There are no acute or suspicious osseous abnormalities. Hemangioma within L2 vertebral body. IMPRESSION: Right perinephric stranding and mild prominence of the proximal renal collecting system. The appearance favors pyelonephritis over renal obstruction. Calcifications in the region of the mid right ureter likely phleboliths rather than ureteric stones. Electronically Signed   By: Rubye OaksMelanie  Ehinger M.D.   On: 01/08/2015 02:29     Medical Consultants:    None.  Anti-Infectives:   Anti-infectives    Start     Dose/Rate Route Frequency Ordered Stop   01/08/15 2300  cefTRIAXone (ROCEPHIN) 1 g in dextrose 5 % 50 mL IVPB     1 g 100 mL/hr over 30 Minutes Intravenous Every 24 hours 01/08/15 0506  01/08/15 0147  cefTRIAXone (ROCEPHIN) 2 G injection    Comments:  Waldo Laine   : cabinet override      01/08/15 0147 01/08/15 0152   01/08/15 0130   cefTRIAXone (ROCEPHIN) 2 g in dextrose 5 % 50 mL IVPB     2 g 100 mL/hr over 30 Minutes Intravenous  Once 01/08/15 0127 01/08/15 0224      Subjective:    Heather Hess she relates her flank pain is improved.  Objective:    Filed Vitals:   01/08/15 2022 01/09/15 0007 01/09/15 0542 01/09/15 0544  BP: 103/56 116/73 103/61   Pulse: 80 88 85   Temp: 98.3 F (36.8 C) 98.8 F (37.1 C) 98.1 F (36.7 C)   TempSrc: Oral Oral Oral   Resp: Height:      Weight:    71.033 kg (156 lb 9.6 oz)  SpO2: 99% 99% 96%     Intake/Output Summary (Last 24 hours) at 01/09/15 0850 Last data filed at 01/09/15 0849  Gross per 24 hour  Intake 2080.5 ml  Output   7225 ml  Net -5144.5 ml   Filed Weights   01/08/15 0030 01/08/15 0502 01/09/15 0544  Weight: 70.761 kg (156 lb) 70.489 kg (155 lb 6.4 oz) 71.033 kg (156 lb 9.6 oz)    Exam: Gen:  NAD Cardiovascular:  RRR, No M/R/G Chest and lungs:   CTAB Abdomen:  Abdomen soft, NT/ND, + BS, positive CVA tenderness. Extremities:  No C/E/C   Data Reviewed:    Labs: Basic Metabolic Panel:  Recent Labs Lab 01/08/15 0135 01/09/15 0622  NA 136 136  K 3.9 3.4*  CL 100* 103  CO2 26 27  GLUCOSE 124* 106*  BUN 13 <5*  CREATININE 1.03* 0.69  CALCIUM 9.0 8.4*   GFR Estimated Creatinine Clearance: 82 mL/min (by C-G formula based on Cr of 0.69). Liver Function Tests:  Recent Labs Lab 01/08/15 0135  AST 19  ALT 10*  ALKPHOS 74  BILITOT 0.6  PROT 7.4  ALBUMIN 4.1   No results for input(s): LIPASE, AMYLASE in the last 168 hours. No results for input(s): AMMONIA in the last 168 hours. Coagulation profile No results for input(s): INR, PROTIME in the last 168 hours.  CBC:  Recent Labs Lab 01/08/15 0135 01/09/15 0622  WBC 16.7* 9.4  NEUTROABS 13.6*  --   HGB 13.9 11.3*  HCT 41.5 34.1*  MCV 93.7 94.7  PLT 151 109*   Cardiac Enzymes: No results for input(s): CKTOTAL, CKMB, CKMBINDEX, TROPONINI in the last 168  hours. BNP (last 3 results) No results for input(s): PROBNP in the last 8760 hours. CBG: No results for input(s): GLUCAP in the last 168 hours. D-Dimer: No results for input(s): DDIMER in the last 72 hours. Hgb A1c: No results for input(s): HGBA1C in the last 72 hours. Lipid Profile: No results for input(s): CHOL, HDL, LDLCALC, TRIG, CHOLHDL, LDLDIRECT in the last 72 hours. Thyroid function studies: No results for input(s): TSH, T4TOTAL, T3FREE, THYROIDAB in the last 72 hours.  Invalid input(s): FREET3 Anemia work up: No results for input(s): VITAMINB12, FOLATE, FERRITIN, TIBC, IRON, RETICCTPCT in the last 72 hours. Sepsis Labs:  Recent Labs Lab 01/08/15 0135 01/08/15 0147 01/08/15 0605 01/09/15 0622  PROCALCITON  --   --  0.42  --   WBC 16.7*  --   --  9.4  LATICACIDVEN  --  0.98  --   --    Microbiology Recent Results (from  the past 240 hour(s))  Culture, blood (routine x 2)     Status: None (Preliminary result)   Collection Time: 01/08/15  1:35 AM  Result Value Ref Range Status   Specimen Description BLOOD RIGHT ANTECUBITAL  Final   Special Requests   Final    BOTTLES DRAWN AEROBIC AND ANAEROBIC 5CC Performed at [facility]    Culture PENDING  Incomplete   Report Status PENDING  Incomplete  Culture, blood (routine x 2)     Status: None (Preliminary result)   Collection Time: 01/08/15  1:40 AM  Result Value Ref Range Status   Specimen Description BLOOD RIGHT HAND  Final   Special Requests   Final    BOTTLES DRAWN AEROBIC AND ANAEROBIC 3CC Performed at [facility]    Culture PENDING  Incomplete   Report Status PENDING  Incomplete     Medications:   . busPIRone  5 mg Oral BID  . cefTRIAXone (ROCEPHIN)  IV  1 g Intravenous Q24H  . DULoxetine  60 mg Oral BID  . enoxaparin (LOVENOX) injection  40 mg Subcutaneous Q24H  . folic acid  1 mg Oral Daily  . Influenza vac split quadrivalent PF  0.5 mL Intramuscular Tomorrow-1000  . multivitamin with  minerals  1 tablet Oral Daily  . nicotine  21 mg Transdermal Daily  . pneumococcal 23 valent vaccine  0.5 mL Intramuscular Tomorrow-1000  . sodium chloride  3 mL Intravenous Q12H  . thiamine  100 mg Oral Daily   Or  . thiamine  100 mg Intravenous Daily  . traZODone  50 mg Oral QHS,MR X 1   Continuous Infusions:   Time spent: 15 min   LOS: 1 day   Marinda Elk  [facility] Pager [phone]  *Please refer to [email or web address], password [email or web address] to get updated schedule on who will round on this patient, as hospitalists switch teams weekly. If 7PM-7AM, please contact night-coverage at [email or web address], password [email or web address] for any overnight needs.  01/09/2015, 8:50 AM

## 2015-01-10 DIAGNOSIS — A4151 Sepsis due to Escherichia coli [E. coli]: Principal | ICD-10-CM

## 2015-01-10 LAB — URINE CULTURE

## 2015-01-10 MED ORDER — CEPHALEXIN 500 MG PO CAPS
500.0000 mg | ORAL_CAPSULE | Freq: Three times a day (TID) | ORAL | Status: DC
  Administered 2015-01-10 – 2015-01-11 (×4): 500 mg via ORAL
  Filled 2015-01-10 (×4): qty 1

## 2015-01-10 MED ORDER — HYDROCODONE-ACETAMINOPHEN 5-325 MG PO TABS
2.0000 | ORAL_TABLET | ORAL | Status: DC | PRN
  Administered 2015-01-10 – 2015-01-11 (×7): 2 via ORAL
  Filled 2015-01-10 (×6): qty 2

## 2015-01-10 MED ORDER — POTASSIUM CHLORIDE CRYS ER 20 MEQ PO TBCR: 40.0000 meq | EXTENDED_RELEASE_TABLET | Freq: Two times a day (BID) | ORAL | Status: DC

## 2015-01-10 NOTE — Progress Notes (Signed)
[facility] PROGRESS NOTE    Progress Note   Heather Hess [id number]:[id number] DOB: [birth year] DOA: 01/08/2015 PCP: Teena Irani, PA-C   Brief Narrative:   Heather Hess is an 54 y.o. female with past medical history of anxiety and depression comes into the ED for right-sided pain fever that started 2 days prior to admission she was found to have pyelonephritis.  Assessment/Plan:   Sepsis (HCC) due to Pyelonephritis: Started empirically on IV Rocephin on 01/08/2015 on admission. Urine culture grew Escherichia coli sensitive to Keflex Will de-escalate antibiotic regimen to Keflex and monitor her overnight. She relates she continues to have right lower quadrant pain she has no leukocytosis has remained afebrile tolerating her diet has had one bowel movement.  Disorder with a history of anxiety, Bipolar 1 disorder, depressed, severe (HCC): Continue Cymbalta, BuSpar, trazodone and hydroxyzine  Acute kidney injury: Baseline creatinine is 0.9. Likely prerenal in the setting of pyelonephritis resolved with IV hydration.  Tobacco abuse: Counseling she was started on a nicotine patch.  alcohol abuse: Monitor with serial protocol started on thiamine and folate.    DVT Prophylaxis - Lovenox ordered.  Family Communication: none Disposition Plan: Home when stable. Code Status:     Code Status Orders        Start     Ordered   01/08/15 0509  Full code   Continuous     01/08/15 0516        IV Access:    Peripheral IV   Procedures and diagnostic studies:   No results found.   Medical Consultants:    None.  Anti-Infectives:   Anti-infectives    Start     Dose/Rate Route Frequency Ordered Stop   01/10/15 0915  cephALEXin (KEFLEX) capsule 500 mg     500 mg Oral 3 times per day 01/10/15 0901     01/08/15 2300  cefTRIAXone (ROCEPHIN) 1 g in dextrose 5 % 50 mL IVPB  Status:  Discontinued     1 g 100 mL/hr over 30 Minutes Intravenous Every 24 hours  01/08/15 0506 01/10/15 0901   01/08/15 0147  cefTRIAXone (ROCEPHIN) 2 G injection    Comments:  Waldo Laine   : cabinet override      01/08/15 0147 01/08/15 0152   01/08/15 0130  cefTRIAXone (ROCEPHIN) 2 g in dextrose 5 % 50 mL IVPB     2 g 100 mL/hr over 30 Minutes Intravenous  Once 01/08/15 0127 01/08/15 0224      Subjective:    Heather Hess now she has right lower quadrant pain, tolerating her diet having bowel movements and hungry.  Objective:    Filed Vitals:   01/09/15 0544 01/09/15 1038 01/09/15 2045 01/10/15 0539  BP:  97/49 106/65 109/69  Pulse:  81 84 94  Temp:  98.3 F (36.8 C) 99.1 F (37.3 C) 99.1 F (37.3 C)  TempSrc:  Oral Oral Oral  Resp:  Height:      Weight: 71.033 kg (156 lb 9.6 oz)   70.534 kg (155 lb 8 oz)  SpO2:  98% 96% 97%    Intake/Output Summary (Last 24 hours) at 01/10/15 0901 Last data filed at 01/10/15 0800  Gross per 24 hour  Intake   3150 ml  Output   6100 ml  Net  -2950 ml   Filed Weights   01/08/15 0502 01/09/15 0544 01/10/15 0539  Weight: 70.489 kg (155 lb 6.4 oz) 71.033 kg (156 lb 9.6 oz) 70.534 kg (155  lb 8 oz)    Exam: Gen:  NAD Cardiovascular:  RRR, No M/R/G Chest and lungs:   CTAB Abdomen:  Abdomen soft, no tenderness no rebound or guarding. Extremities:  No C/E/C   Data Reviewed:    Labs: Basic Metabolic Panel:  Recent Labs Lab 01/08/15 0135 01/09/15 0622  NA 136 136  K 3.9 3.4*  CL 100* 103  CO2 26 27  GLUCOSE 124* 106*  BUN 13 <5*  CREATININE 1.03* 0.69  CALCIUM 9.0 8.4*   GFR Estimated Creatinine Clearance: 82 mL/min (by C-G formula based on Cr of 0.69). Liver Function Tests:  Recent Labs Lab 01/08/15 0135  AST 19  ALT 10*  ALKPHOS 74  BILITOT 0.6  PROT 7.4  ALBUMIN 4.1   No results for input(s): LIPASE, AMYLASE in the last 168 hours. No results for input(s): AMMONIA in the last 168 hours. Coagulation profile No results for input(s): INR, PROTIME in the last 168  hours.  CBC:  Recent Labs Lab 01/08/15 0135 01/09/15 0622  WBC 16.7* 9.4  NEUTROABS 13.6*  --   HGB 13.9 11.3*  HCT 41.5 34.1*  MCV 93.7 94.7  PLT 151 109*   Cardiac Enzymes: No results for input(s): CKTOTAL, CKMB, CKMBINDEX, TROPONINI in the last 168 hours. BNP (last 3 results) No results for input(s): PROBNP in the last 8760 hours. CBG: No results for input(s): GLUCAP in the last 168 hours. D-Dimer: No results for input(s): DDIMER in the last 72 hours. Hgb A1c: No results for input(s): HGBA1C in the last 72 hours. Lipid Profile: No results for input(s): CHOL, HDL, LDLCALC, TRIG, CHOLHDL, LDLDIRECT in the last 72 hours. Thyroid function studies: No results for input(s): TSH, T4TOTAL, T3FREE, THYROIDAB in the last 72 hours.  Invalid input(s): FREET3 Anemia work up: No results for input(s): VITAMINB12, FOLATE, FERRITIN, TIBC, IRON, RETICCTPCT in the last 72 hours. Sepsis Labs:  Recent Labs Lab 01/08/15 0135 01/08/15 0147 01/08/15 0605 01/09/15 0622  PROCALCITON  --   --  0.42  --   WBC 16.7*  --   --  9.4  LATICACIDVEN  --  0.98  --   --    Microbiology Recent Results (from the past 240 hour(s))  Urine culture     Status: None   Collection Time: 01/08/15 12:30 AM  Result Value Ref Range Status   Specimen Description URINE, CLEAN CATCH  Final   Special Requests NONE  Final   Culture   Final    >=100,000 COLONIES/mL ESCHERICHIA COLI [facility]    Report Status 01/10/2015 FINAL  Final   Organism ID, Bacteria ESCHERICHIA COLI  Final      Susceptibility   Escherichia coli - MIC*    AMPICILLIN 4 SENSITIVE Sensitive     CEFAZOLIN <=4 SENSITIVE Sensitive     CEFTRIAXONE <=1 SENSITIVE Sensitive     CIPROFLOXACIN <=0.25 SENSITIVE Sensitive     GENTAMICIN <=1 SENSITIVE Sensitive     IMIPENEM <=0.25 SENSITIVE Sensitive     NITROFURANTOIN 64 INTERMEDIATE Intermediate     TRIMETH/SULFA >=320 RESISTANT Resistant     AMPICILLIN/SULBACTAM <=2  SENSITIVE Sensitive     PIP/TAZO <=4 SENSITIVE Sensitive     * >=100,000 COLONIES/mL ESCHERICHIA COLI  Culture, blood (routine x 2)     Status: None (Preliminary result)   Collection Time: 01/08/15  1:35 AM  Result Value Ref Range Status   Specimen Description BLOOD RIGHT ANTECUBITAL  Final   Special Requests BOTTLES DRAWN AEROBIC AND  ANAEROBIC 5CC  Final   Culture   Final    NO GROWTH 1 DAY Performed at [facility]    Report Status PENDING  Incomplete  Culture, blood (routine x 2)     Status: None (Preliminary result)   Collection Time: 01/08/15  1:40 AM  Result Value Ref Range Status   Specimen Description BLOOD RIGHT HAND  Final   Special Requests BOTTLES DRAWN AEROBIC AND ANAEROBIC 3CC  Final   Culture   Final    NO GROWTH 1 DAY Performed at [facility]    Report Status PENDING  Incomplete  Culture, blood (x 2)     Status: None (Preliminary result)   Collection Time: 01/08/15  6:05 AM  Result Value Ref Range Status   Specimen Description BLOOD LEFT ARM  Final   Special Requests BOTTLES DRAWN AEROBIC ONLY 2CC  Final   Culture NO GROWTH 1 DAY  Final   Report Status PENDING  Incomplete  Culture, blood (x 2)     Status: None (Preliminary result)   Collection Time: 01/08/15  6:10 AM  Result Value Ref Range Status   Specimen Description BLOOD LEFT HAND  Final   Special Requests BOTTLES DRAWN AEROBIC ONLY 3CC  Final   Culture NO GROWTH 1 DAY  Final   Report Status PENDING  Incomplete     Medications:   . busPIRone  5 mg Oral BID  . cephALEXin  500 mg Oral 3 times per day  . DULoxetine  60 mg Oral BID  . enoxaparin (LOVENOX) injection  40 mg Subcutaneous Q24H  . folic acid  1 mg Oral Daily  . multivitamin with minerals  1 tablet Oral Daily  . nicotine  21 mg Transdermal Daily  . sodium chloride  3 mL Intravenous Q12H  . thiamine  100 mg Oral Daily  . traZODone  50 mg Oral QHS,MR X 1   Continuous Infusions:   Time spent: 15 min   LOS: 2 days    Marinda Elk  [facility] Pager [phone]  *Please refer to [email or web address], password [email or web address] to get updated schedule on who will round on this patient, as hospitalists switch teams weekly. If 7PM-7AM, please contact night-coverage at [email or web address], password [email or web address] for any overnight needs.  01/10/2015, 9:01 AM

## 2015-01-11 MED ORDER — DIPHENHYDRAMINE HCL 50 MG PO TABS: 25.0000 mg | ORAL_TABLET | Freq: Every evening | ORAL | Status: DC | PRN

## 2015-01-11 MED ORDER — CEPHALEXIN 500 MG PO CAPS: 500.0000 mg | ORAL_CAPSULE | Freq: Three times a day (TID) | ORAL | Status: DC

## 2015-01-11 MED ORDER — POTASSIUM CHLORIDE CRYS ER 20 MEQ PO TBCR
40.0000 meq | EXTENDED_RELEASE_TABLET | Freq: Two times a day (BID) | ORAL | Status: DC
  Administered 2015-01-11: 40 meq via ORAL
  Filled 2015-01-11: qty 2

## 2015-01-11 MED ORDER — OXYCODONE-ACETAMINOPHEN 5-325 MG PO TABS: 2.0000 | ORAL_TABLET | ORAL | Status: DC | PRN

## 2015-01-11 MED ORDER — OXYCODONE-ACETAMINOPHEN 5-325 MG PO TABS
2.0000 | ORAL_TABLET | ORAL | Status: DC | PRN
  Administered 2015-01-11: 2 via ORAL
  Filled 2015-01-11: qty 2

## 2015-01-11 NOTE — Discharge Summary (Signed)
Physician Discharge Summary  Heather Hess WUJ:[id number] DOB: 18-Apr-1960 DOA: 01/08/2015  PCP: Teena Irani, PA-C  Admit date: 01/08/2015 Discharge date: 01/11/2015  Time spent: 35 minutes  Recommendations for Outpatient Follow-up:  1. Follow-up with PCP in 2 weeks   Discharge Diagnoses:  Principal Problem:   Sepsis due to Escherichia coli (E. 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  Hospital Course:  Sepsis due to acute pyelonephritis: She was started on IV Rocephin urine culture grew Escherichia coli sensitive to Keflex she was changed to Keflex and monitor for 24 hours she defervesced her white count she will continue the Keflex for 5 additional days.  Acute kidney injury: This likely. On the setting of pyelonephritis does resolve with IV fluid hydration.   Other medical problems are stable no changes were made to her medication.   Procedures:  CT of Kidneys  Consultations:  none  Accuchecks 4 times/day, Once in AM empty stomach and then before each meal. Log in all results and show them to your Prim.MD in 3 days. If any glucose reading is under 80 or above 300 call your Prim MD immidiately. Follow Low glucose instructions for glucose under 80 as instructed.  For Heart failure patients - Check your Weight same time everyday, if you gain over 2 pounds, or you develop in leg swelling, experience more shortness of breath or chest pain, call your Primary MD immediately. Follow  Cardiac Low Salt Diet and 1.5 lit/day fluid restriction.   On your next visit with your primary care physician please Get Medicines reviewed and adjusted.   Please request your Prim.MD to go over all Hospital Tests and Procedure/Radiological results at the follow up, please get all Hospital records sent to your Prim MD by signing hospital release before you go home.   If you experience worsening of your admission symptoms, develop shortness of breath, life threatening emergency, suicidal or homicidal thoughts you must seek medical attention immediately by calling 911 or calling your MD immediately if symptoms less severe.  You Must read complete instructions/literature along with all the possible adverse reactions/side effects for all the Medicines you take and that have been prescribed to you. Take any new Medicines after you have completely understood and accpet all the possible adverse reactions/side effects.   Do not drive, operating heavy machinery, perform activities at heights, swimming or participation in water activities or provide baby sitting services if your were admitted for syncope or siezures until you have seen by Primary MD or a Neurologist and advised to do so again.  Do not drive when taking Pain medications.    Do not take more than prescribed Pain, Sleep and Anxiety Medications  Special Instructions: If you have smoked or chewed Tobacco in the last 2 yrs please stop smoking, stop any regular Alcohol and or any Recreational drug use.  Wear Seat belts while driving.   Please note  You were cared for by a hospitalist during your hospital stay.  If you have any questions about your discharge medications or the care you received while you were in the hospital after you are discharged, you can call the unit and asked to speak with the hospitalist on call if the hospitalist that took care of you is not available. Once you are discharged, your primary care physician will  handle any further medical issues. Please note that NO REFILLS for any discharge medications will be authorized once you are discharged, as it is imperative that you return to your primary care physician (or establish a relationship with a primary care physician if you do not have one) for your aftercare needs so that they can reassess your need for medications and monitor your lab values.  Discharge Exam: Filed Vitals:   01/11/15 0957  BP:   Pulse:   Temp: 98.7 F (37.1 C)  Resp:     General: A&O x3 Cardiovascular: RRR Respiratory: good air movemen CTA B/L  Discharge Instructions    Current Discharge Medication List    START taking these medications   Details  cephALEXin (KEFLEX) 500 MG capsule Take 1 capsule (500 mg total) by mouth every 8 (eight) hours. Qty: 15 capsule, Refills: 0    oxyCODONE-acetaminophen (PERCOCET/ROXICET) 5-325 MG tablet Take 2 tablets by mouth every 4 (four) hours as needed for moderate pain. Qty: 10 tablet, Refills: 0      CONTINUE these medications which have NOT CHANGED   Details  busPIRone (BUSPAR) 5 MG tablet Take 1 tablet (5 mg total) by mouth 2 (two) times daily. Qty: 60 tablet, Refills: 0    DULoxetine (CYMBALTA) 60 MG capsule Take 1 capsule (60 mg total) by mouth 2 (two) times daily. Qty: 60 capsule, Refills: 0    hydrOXYzine (ATARAX/VISTARIL) 25 MG tablet Take 1 tablet (25 mg total) by mouth every 6 (six) hours as needed for anxiety. Qty: 30 tablet, Refills: 0    nicotine (NICODERM CQ - DOSED IN MG/24 HOURS) 21 mg/24hr patch Place 1 patch (21 mg total) onto the skin daily. Qty: 28 patch, Refills: 0    traZODone (DESYREL) 50 MG tablet Take 1 tablet (50 mg total) by mouth at bedtime and may repeat dose one time if needed. Qty: 30 tablet, Refills: 0       Allergies  Allergen Reactions  . Nsaids Hives   Follow-up Information    Follow up with SPENCER,SARA C, PA-C In 2 weeks.   Specialty:  Physician Assistant   Why:  hospital  follow up   Contact information:   [address] [place] [id number] [phone]        The results of significant diagnostics from this hospitalization (including imaging, microbiology, ancillary and laboratory) are listed below for reference.    Significant Diagnostic Studies: Ct Renal Stone Study  01/08/2015  CLINICAL DATA:  Right flank and lower quadrant pain for 10 hours. Nausea. Hematuria. EXAM: CT ABDOMEN AND PELVIS WITHOUT CONTRAST TECHNIQUE: Multidetector CT imaging of the abdomen and pelvis was performed following the standard protocol without IV contrast. COMPARISON:  None. FINDINGS: Lower chest:  The included lung bases are clear. Liver: No evidence focal lesion allowing for lack contrast. Hepatobiliary: Clips in the gallbladder fossa postcholecystectomy. No biliary dilatation. Pancreas: Normal. Spleen: Normal. Adrenal glands: No nodule. Kidneys: There is perinephric stranding about the right kidney with prominence of the right renal pelvis and proximal ureter. Calcifications in the region of the right mid ureter, image 51 and 53, appear to be related to phleboliths rather  than ureteric stones. Left-sided phleboliths are noted similar location. Unenhanced left kidney appears normal. No left urolithiasis. Stomach/Bowel: Stomach physiologically distended. There are no dilated or thickened small bowel loops. Small volume of stool throughout the colon without colonic wall thickening. The appendix is normal. Vascular/Lymphatic: No retroperitoneal adenopathy. Abdominal aorta is normal in caliber. Mild atherosclerosis without aneurysm. Reproductive: Uterus is surgically absent. No adnexal mass. Ovaries are not discretely visualized. Bladder: Physiologically distended with mild wall thickening involving the dome. No intravesicular air. Other: No free air, free fluid, or intra-abdominal fluid collection. Musculoskeletal: There are no acute or suspicious osseous abnormalities. Hemangioma  within L2 vertebral body. IMPRESSION: Right perinephric stranding and mild prominence of the proximal renal collecting system. The appearance favors pyelonephritis over renal obstruction. Calcifications in the region of the mid right ureter likely phleboliths rather than ureteric stones. Electronically Signed   By: Rubye Oaks M.D.   On: 01/08/2015 02:29    Microbiology: Recent Results (from the past 240 hour(s))  Urine culture     Status: None   Collection Time: 01/08/15 12:30 AM  Result Value Ref Range Status   Specimen Description URINE, CLEAN CATCH  Final   Special Requests NONE  Final   Culture   Final    >=100,000 COLONIES/mL ESCHERICHIA COLI Performed at [facility]    Report Status 01/10/2015 FINAL  Final   Organism ID, Bacteria ESCHERICHIA COLI  Final      Susceptibility   Escherichia coli - MIC*    AMPICILLIN 4 SENSITIVE Sensitive     CEFAZOLIN <=4 SENSITIVE Sensitive     CEFTRIAXONE <=1 SENSITIVE Sensitive     CIPROFLOXACIN <=0.25 SENSITIVE Sensitive     GENTAMICIN <=1 SENSITIVE Sensitive     IMIPENEM <=0.25 SENSITIVE Sensitive     NITROFURANTOIN 64 INTERMEDIATE Intermediate     TRIMETH/SULFA >=320 RESISTANT Resistant     AMPICILLIN/SULBACTAM <=2 SENSITIVE Sensitive     PIP/TAZO <=4 SENSITIVE Sensitive     * >=100,000 COLONIES/mL ESCHERICHIA COLI  Culture, blood (routine x 2)     Status: None (Preliminary result)   Collection Time: 01/08/15  1:35 AM  Result Value Ref Range Status   Specimen Description BLOOD RIGHT ANTECUBITAL  Final   Special Requests BOTTLES DRAWN AEROBIC AND ANAEROBIC 5CC  Final   Culture   Final    NO GROWTH 2 DAYS Performed at [facility]    Report Status PENDING  Incomplete  Culture, blood (routine x 2)     Status: None (Preliminary result)   Collection Time: 01/08/15  1:40 AM  Result Value Ref Range Status   Specimen Description BLOOD RIGHT HAND  Final   Special Requests BOTTLES DRAWN AEROBIC AND ANAEROBIC 3CC  Final    Culture   Final    NO GROWTH 2 DAYS Performed at [facility]    Report Status PENDING  Incomplete  Culture, blood (x 2)     Status: None (Preliminary result)   Collection Time: 01/08/15  6:05 AM  Result Value Ref Range Status   Specimen Description BLOOD LEFT ARM  Final   Special Requests BOTTLES DRAWN AEROBIC ONLY 2CC  Final   Culture NO GROWTH 2 DAYS  Final   Report Status PENDING  Incomplete  Culture, blood (x 2)     Status: None (Preliminary result)   Collection Time: 01/08/15  6:10 AM  Result Value Ref Range Status   Specimen Description BLOOD LEFT HAND  Final   Special Requests BOTTLES DRAWN  AEROBIC ONLY 3CC  Final   Culture NO GROWTH 2 DAYS  Final   Report Status PENDING  Incomplete     Labs: Basic Metabolic Panel:  Recent Labs Lab 01/08/15 0135 01/09/15 0622  NA 136 136  K 3.9 3.4*  CL 100* 103  CO2 26 27  GLUCOSE 124* 106*  BUN 13 <5*  CREATININE 1.03* 0.69  CALCIUM 9.0 8.4*   Liver Function Tests:  Recent Labs Lab 01/08/15 0135  AST 19  ALT 10*  ALKPHOS 74  BILITOT 0.6  PROT 7.4  ALBUMIN 4.1   No results for input(s): LIPASE, AMYLASE in the last 168 hours. No results for input(s): AMMONIA in the last 168 hours. CBC:  Recent Labs Lab 01/08/15 0135 01/09/15 0622  WBC 16.7* 9.4  NEUTROABS 13.6*  --   HGB 13.9 11.3*  HCT 41.5 34.1*  MCV 93.7 94.7  PLT 151 109*   Cardiac Enzymes: No results for input(s): CKTOTAL, CKMB, CKMBINDEX, TROPONINI in the last 168 hours. BNP: BNP (last 3 results) No results for input(s): BNP in the last 8760 hours.  ProBNP (last 3 results) No results for input(s): PROBNP in the last 8760 hours.  CBG: No results for input(s): GLUCAP in the last 168 hours.     Signed:  Marinda Elk  [facility] 01/11/2015, 10:51 AM

## 2015-01-13 LAB — CULTURE, BLOOD (ROUTINE X 2)
CULTURE: NO GROWTH
CULTURE: NO GROWTH
Culture: NO GROWTH
Culture: NO GROWTH

## 2015-01-15 ENCOUNTER — Emergency Department (HOSPITAL_BASED_OUTPATIENT_CLINIC_OR_DEPARTMENT_OTHER): Payer: Self-pay

## 2015-01-15 ENCOUNTER — Emergency Department (HOSPITAL_BASED_OUTPATIENT_CLINIC_OR_DEPARTMENT_OTHER)
Admission: EM | Admit: 2015-01-15 | Discharge: 2015-01-15 | Disposition: A | Discharge status: Home / Self Care | Payer: Self-pay | Attending: Emergency Medicine | Admitting: Emergency Medicine

## 2015-01-15 ENCOUNTER — Encounter (HOSPITAL_BASED_OUTPATIENT_CLINIC_OR_DEPARTMENT_OTHER): Payer: Self-pay | Admitting: *Deleted

## 2015-01-15 DIAGNOSIS — R109 Unspecified abdominal pain: Secondary | ICD-10-CM

## 2015-01-15 DIAGNOSIS — Z8739 Personal history of other diseases of the musculoskeletal system and connective tissue: Secondary | ICD-10-CM | POA: Insufficient documentation

## 2015-01-15 DIAGNOSIS — R1012 Left upper quadrant pain: Secondary | ICD-10-CM | POA: Insufficient documentation

## 2015-01-15 DIAGNOSIS — F329 Major depressive disorder, single episode, unspecified: Secondary | ICD-10-CM | POA: Insufficient documentation

## 2015-01-15 DIAGNOSIS — F17210 Nicotine dependence, cigarettes, uncomplicated: Secondary | ICD-10-CM | POA: Insufficient documentation

## 2015-01-15 DIAGNOSIS — R197 Diarrhea, unspecified: Secondary | ICD-10-CM | POA: Insufficient documentation

## 2015-01-15 DIAGNOSIS — R509 Fever, unspecified: Secondary | ICD-10-CM | POA: Insufficient documentation

## 2015-01-15 DIAGNOSIS — R05 Cough: Secondary | ICD-10-CM | POA: Insufficient documentation

## 2015-01-15 DIAGNOSIS — G8929 Other chronic pain: Secondary | ICD-10-CM | POA: Insufficient documentation

## 2015-01-15 DIAGNOSIS — F419 Anxiety disorder, unspecified: Secondary | ICD-10-CM | POA: Insufficient documentation

## 2015-01-15 DIAGNOSIS — Z79899 Other long term (current) drug therapy: Secondary | ICD-10-CM | POA: Insufficient documentation

## 2015-01-15 DIAGNOSIS — R1013 Epigastric pain: Secondary | ICD-10-CM | POA: Insufficient documentation

## 2015-01-15 LAB — CBC WITH DIFFERENTIAL/PLATELET
BASOS ABS: 0.1 10*3/uL (ref 0.0–0.1)
BASOS PCT: 1 %
EOS ABS: 0 10*3/uL (ref 0.0–0.7)
Eosinophils Relative: 0 %
HEMATOCRIT: 41.3 % (ref 36.0–46.0)
Hemoglobin: 13.6 g/dL (ref 12.0–15.0)
LYMPHS PCT: 41 %
Lymphs Abs: 3 10*3/uL (ref 0.7–4.0)
MCH: 30.6 pg (ref 26.0–34.0)
MCHC: 32.9 g/dL (ref 30.0–36.0)
MCV: 93 fL (ref 78.0–100.0)
MONO ABS: 0.1 10*3/uL (ref 0.1–1.0)
MONOS PCT: 2 %
MYELOCYTES: 3 %
NEUTROS PCT: 53 %
Neutro Abs: 4.2 10*3/uL (ref 1.7–7.7)
PLATELETS: 427 10*3/uL — AB (ref 150–400)
RBC: 4.44 MIL/uL (ref 3.87–5.11)
RDW: 12.2 % (ref 11.5–15.5)
WBC: 7.4 10*3/uL (ref 4.0–10.5)

## 2015-01-15 LAB — LIPASE, BLOOD: Lipase: 32 U/L (ref 11–51)

## 2015-01-15 LAB — HEPATIC FUNCTION PANEL
ALK PHOS: 209 U/L — AB (ref 38–126)
ALT: 64 U/L — AB (ref 14–54)
AST: 31 U/L (ref 15–41)
Albumin: 3.9 g/dL (ref 3.5–5.0)
TOTAL PROTEIN: 8.2 g/dL — AB (ref 6.5–8.1)
Total Bilirubin: 0.6 mg/dL (ref 0.3–1.2)

## 2015-01-15 LAB — URINALYSIS, ROUTINE W REFLEX MICROSCOPIC
Bilirubin Urine: NEGATIVE
GLUCOSE, UA: NEGATIVE mg/dL
Hgb urine dipstick: NEGATIVE
KETONES UR: NEGATIVE mg/dL
LEUKOCYTES UA: NEGATIVE
NITRITE: NEGATIVE
PH: 7 (ref 5.0–8.0)
Protein, ur: NEGATIVE mg/dL
Specific Gravity, Urine: 1.003 — ABNORMAL LOW (ref 1.005–1.030)

## 2015-01-15 LAB — BASIC METABOLIC PANEL
ANION GAP: 10 (ref 5–15)
BUN: 10 mg/dL (ref 6–20)
CALCIUM: 9.8 mg/dL (ref 8.9–10.3)
CO2: 26 mmol/L (ref 22–32)
Chloride: 101 mmol/L (ref 101–111)
Creatinine, Ser: 0.77 mg/dL (ref 0.44–1.00)
GFR calc non Af Amer: >60 mL/min (ref >60)
Glucose, Bld: 110 mg/dL — ABNORMAL HIGH (ref 65–99)
POTASSIUM: 4.5 mmol/L (ref 3.5–5.1)
Sodium: 137 mmol/L (ref 135–145)

## 2015-01-15 LAB — I-STAT CG4 LACTIC ACID, ED: Lactic Acid, Venous: 1.68 mmol/L (ref 0.5–2.0)

## 2015-01-15 MED ORDER — MORPHINE SULFATE (PF) 4 MG/ML IV SOLN
4.0000 mg | Freq: Once | INTRAVENOUS | Status: AC
  Administered 2015-01-15: 4 mg via INTRAVENOUS
  Filled 2015-01-15: qty 1

## 2015-01-15 MED ORDER — SODIUM CHLORIDE 0.9 % IV BOLUS (SEPSIS)
1000.0000 mL | Freq: Once | INTRAVENOUS | Status: AC
  Administered 2015-01-15: 1000 mL via INTRAVENOUS

## 2015-01-15 NOTE — ED Provider Notes (Addendum)
CSN: [id number]     Arrival date & time 01/15/15  1141 History   First MD Initiated Contact with Patient 01/15/15 1156     Chief Complaint  Patient presents with  . Fever     (Consider location/radiation/quality/duration/timing/severity/associated sxs/prior Treatment) HPI  54 year old female presents with a chief complaint of fever. Maximum temperature was 99.7. She was discharged from the hospital for pyelonephritis on the right side on 11/13. She is finishing up her Keflex in. Patient states that since leaving the hospital she seems to be getting more and more fatigue and started noticing her fever as well as chills/myalgias. Has a mild dry cough. Her right flank pain is still present and it was getting better but seems to be getting a little worse. Her urine is dark but there is no dysuria. Now she is also having left upper quadrant abdominal pain. No headaches. No nausea or vomiting. She is having watery diarrhea as well. She has has her gallbladder removed.  Past Medical History  Diagnosis Date  . Depression   . Herniated cervical disc   . Anxiety   . Chronic back pain   . Alcohol abuse   . Opiate abuse, episodic    Past Surgical History  Procedure Laterality Date  . Abdominal hysterectomy    . Cholecystectomy    . Tonsillectomy    . Hemorrhoid surgery     No family history on file. Social History  Substance Use Topics  . Smoking status: Current Every Day Smoker -- 0.25 packs/day for 10 years    Types: Cigarettes  . Smokeless tobacco: Never Used  . Alcohol Use: Yes     Comment: recovering alcoholic    OB History    No data available     Review of Systems  Constitutional: Positive for fever and chills.  Respiratory: Positive for cough. Negative for shortness of breath.   Gastrointestinal: Positive for abdominal pain and diarrhea. Negative for nausea and vomiting.  Genitourinary: Negative for dysuria and hematuria.  Musculoskeletal: Positive for back pain.  All  other systems reviewed and are negative.     Allergies  Nsaids  Home Medications   Prior to Admission medications   Medication Sig Start Date End Date Taking? Authorizing Provider  busPIRone (BUSPAR) 5 MG tablet Take 1 tablet (5 mg total) by mouth 2 (two) times daily. 07/26/14   Adonis Brook, NP  cephALEXin (KEFLEX) 500 MG capsule Take 1 capsule (500 mg total) by mouth every 8 (eight) hours. 01/11/15   Marinda Elk, MD  diphenhydrAMINE (BENADRYL) 50 MG tablet Take 0.5 tablets (25 mg total) by mouth at bedtime as needed for itching. 01/11/15   Marinda Elk, MD  DULoxetine (CYMBALTA) 60 MG capsule Take 1 capsule (60 mg total) by mouth 2 (two) times daily. 07/26/14   Adonis Brook, NP  hydrOXYzine (ATARAX/VISTARIL) 25 MG tablet Take 1 tablet (25 mg total) by mouth every 6 (six) hours as needed for anxiety. 07/26/14   Adonis Brook, NP  nicotine (NICODERM CQ - DOSED IN MG/24 HOURS) 21 mg/24hr patch Place 1 patch (21 mg total) onto the skin daily. 07/26/14   Adonis Brook, NP  oxyCODONE-acetaminophen (PERCOCET/ROXICET) 5-325 MG tablet Take 2 tablets by mouth every 4 (four) hours as needed for moderate pain. 01/11/15   Marinda Elk, MD  traZODone (DESYREL) 50 MG tablet Take 1 tablet (50 mg total) by mouth at bedtime and may repeat dose one time if needed. 07/26/14   Adonis Brook, NP  BP 132/90 mmHg  Pulse 80  Temp(Src) 98.9 F (37.2 C) (Oral)  Resp 16  Ht 5\' 8"  (1.727 m)  Wt 150 lb (68.04 kg)  BMI 22.81 kg/m2  SpO2 100% Physical Exam  Constitutional: She is oriented to person, place, and time. She appears well-developed and well-nourished.  HENT:  Head: Normocephalic and atraumatic.  Right Ear: External ear normal.  Left Ear: External ear normal.  Nose: Nose normal.  Eyes: Right eye exhibits no discharge. Left eye exhibits no discharge.  Cardiovascular: Normal rate, regular rhythm and normal heart sounds.   Pulmonary/Chest: Effort normal and breath sounds  normal.  Abdominal: Soft. There is tenderness in the epigastric area and left upper quadrant. There is CVA tenderness (right sided).  Neurological: She is alert and oriented to person, place, and time.  Skin: Skin is warm and dry.  Nursing note and vitals reviewed.   ED Course  Procedures (including critical care time) Labs Review Labs Reviewed  URINALYSIS, ROUTINE W REFLEX MICROSCOPIC (NOT AT [facility]) - Abnormal; Notable for the following:    Specific Gravity, Urine 1.003 (*)    All other components within normal limits  CBC WITH DIFFERENTIAL/PLATELET - Abnormal; Notable for the following:    Platelets 427 (*)    All other components within normal limits  BASIC METABOLIC PANEL - Abnormal; Notable for the following:    Glucose, Bld 110 (*)    All other components within normal limits  HEPATIC FUNCTION PANEL - Abnormal; Notable for the following:    Total Protein 8.2 (*)    ALT 64 (*)    Alkaline Phosphatase 209 (*)    Bilirubin, Direct <0.1 (*)    All other components within normal limits  LIPASE, BLOOD  LACTIC ACID, PLASMA  LACTIC ACID, PLASMA  I-STAT CG4 LACTIC ACID, ED    Imaging Review Dg Chest 2 View  01/15/2015  CLINICAL DATA:  Cough, fever. EXAM: CHEST  2 VIEW COMPARISON:  None. FINDINGS: The heart size and mediastinal contours are within normal limits. Both lungs are clear. No pneumothorax or pleural effusion is noted. The visualized skeletal structures are unremarkable. IMPRESSION: No active cardiopulmonary disease. Electronically Signed   By: Lupita RaiderJames  Green Jr, M.D.   On: 01/15/2015 13:11   [place] Abdomen Complete  01/15/2015  CLINICAL DATA:  Generalized abdominal and back pain for 1 day. Chills. Body aches. EXAM: ULTRASOUND ABDOMEN COMPLETE COMPARISON:  CT of the abdomen and pelvis 01/08/2015 FINDINGS: Gallbladder: Status post cholecystectomy. Common bile duct: Diameter: 6.0 mm Liver: No focal lesion identified. Within normal limits in parenchymal echogenicity. IVC: No  abnormality visualized. Pancreas: Visualized portion unremarkable. Spleen: Size and appearance within normal limits. Right Kidney: Length: 12.1 cm in length. No focal mass. There is trace fluid within the right renal pelvis and proximal ureter. Left Kidney: Length: 12.2 cm.  No hydronephrosis or mass. Abdominal aorta: No aneurysm visualized. Other findings: None. IMPRESSION: 1. Status post cholecystectomy. 2. Mild prominence of the right proximal ureter and renal pelvis. 3. Otherwise the study is normal. Electronically Signed   By: Norva PavlovElizabeth  Brown M.D.   On: 01/15/2015 13:42   I have personally reviewed and evaluated these images and lab results as part of my medical decision-making.   EKG Interpretation None      MDM   Final diagnoses:  Right flank pain    Patient has not been febrile here and her vital signs are unremarkable. No signs of sepsis and her white count has normalized. There is no  longer a UTI according to the urinalysis. Initially had upper abdominal pain although on recheck there is no further abdominal pain, only the right flank pain. She does have mild prominence of her proximal ureter and pelvis this does not seem worse than it was on the CT scan last week. Given everything else seems to be improving I highly doubt a renal abscess or other acute intra-abdominal emergency. I did discuss a possible repeat CT with patient but she does not want this. It seems like she is improving some but does not likely she wants. Unclear why she is so fatigued. At this point there appears to be no emergent pathology and I recommended she follow-up with her PCP as an outpatient. Discussed strict return precautions.    Pricilla Loveless, MD 01/15/15 1428  Pricilla Loveless, MD 01/15/15 [phone]

## 2015-01-15 NOTE — ED Notes (Signed)
[facility] with [facility] last week. Here today with fever, chills, body aches.

## 2015-01-15 NOTE — ED Notes (Signed)
Patient transported to X-ray 

## 2015-01-15 NOTE — ED Notes (Signed)
Patient transported to radiology dept.  

## 2015-01-15 NOTE — Discharge Instructions (Signed)
Flank Pain Flank pain refers to pain that is located on the side of the body between the upper abdomen and the back. The pain may occur over a short period of time (acute) or may be long-term or reoccurring (chronic). It may be mild or severe. Flank pain can be caused by many things. CAUSES  Some of the more common causes of flank pain include:  Muscle strains.   Muscle spasms.   A disease of your spine (vertebral disk disease).   A lung infection (pneumonia).   Fluid around your lungs (pulmonary edema).   A kidney infection.   Kidney stones.   A very painful skin rash caused by the chickenpox virus (shingles).   Gallbladder disease.  HOME CARE INSTRUCTIONS  Home care will depend on the cause of your pain. In general,  Rest as directed by your caregiver.  Drink enough fluids to keep your urine clear or pale yellow.  Only take over-the-counter or prescription medicines as directed by your caregiver. Some medicines may help relieve the pain.  Tell your caregiver about any changes in your pain.  Follow up with your caregiver as directed. SEEK IMMEDIATE MEDICAL CARE IF:   Your pain is not controlled with medicine.   You have new or worsening symptoms.  Your pain increases.   You have abdominal pain.   You have shortness of breath.   You have persistent nausea or vomiting.   You have swelling in your abdomen.   You feel faint or pass out.   You have blood in your urine.  You have a fever or persistent symptoms for more than 2-3 days.  You have a fever and your symptoms suddenly get worse. MAKE SURE YOU:   Understand these instructions.  Will watch your condition.  Will get help right away if you are not doing well or get worse.   This information is not intended to replace advice given to you by your health care provider. Make sure you discuss any questions you have with your health care provider.   Document Released: 04/07/2005 Document  Revised: 11/09/2011 Document Reviewed: 09/29/2011 Elsevier Interactive Patient Education 2016 [employer]    Abdominal Pain, Adult Many things can cause abdominal pain. Usually, abdominal pain is not caused by a disease and will improve without treatment. It can often be observed and treated at home. Your health care provider will do a physical exam and possibly order blood tests and X-rays to help determine the seriousness of your pain. However, in many cases, more time must pass before a clear cause of the pain can be found. Before that point, your health care provider may not know if you need more testing or further treatment. HOME CARE INSTRUCTIONS Monitor your abdominal pain for any changes. The following actions may help to alleviate any discomfort you are experiencing:  Only take over-the-counter or prescription medicines as directed by your health care provider.  Do not take laxatives unless directed to do so by your health care provider.  Try a clear liquid diet (broth, tea, or water) as directed by your health care provider. Slowly move to a bland diet as tolerated. SEEK MEDICAL CARE IF:  You have unexplained abdominal pain.  You have abdominal pain associated with nausea or diarrhea.  You have pain when you urinate or have a bowel movement.  You experience abdominal pain that wakes you in the night.  You have abdominal pain that is worsened or improved by eating food.  You have   abdominal pain that is worsened with eating fatty foods.  You have a fever. SEEK IMMEDIATE MEDICAL CARE IF:  Your pain does not go away within 2 hours.  You keep throwing up (vomiting).  Your pain is felt only in portions of the abdomen, such as the right side or the left lower portion of the abdomen.  You pass bloody or black tarry stools. MAKE SURE YOU:  Understand these instructions.  Will watch your condition.  Will get help right away if you are not doing well or get worse.     This information is not intended to replace advice given to you by your health care provider. Make sure you discuss any questions you have with your health care provider.   Document Released: 11/24/2004 Document Revised: 11/05/2014 Document Reviewed: 10/24/2012 Elsevier Interactive Patient Education 2016 [employer]vier Inc.  

## 2015-08-14 ENCOUNTER — Emergency Department (HOSPITAL_COMMUNITY): Payer: Self-pay

## 2015-08-14 ENCOUNTER — Ambulatory Visit (HOSPITAL_COMMUNITY)
Admission: EM | Admit: 2015-08-14 | Discharge: 2015-08-14 | Disposition: A | Discharge status: Home / Self Care | Payer: Self-pay | Attending: Family Medicine | Admitting: Family Medicine

## 2015-08-14 ENCOUNTER — Emergency Department (HOSPITAL_COMMUNITY)
Admission: EM | Admit: 2015-08-14 | Discharge: 2015-08-14 | Disposition: A | Discharge status: Home / Self Care | Payer: Self-pay | Attending: Emergency Medicine | Admitting: Emergency Medicine

## 2015-08-14 ENCOUNTER — Encounter (HOSPITAL_COMMUNITY): Payer: Self-pay | Admitting: Nurse Practitioner

## 2015-08-14 ENCOUNTER — Encounter (HOSPITAL_COMMUNITY): Payer: Self-pay | Admitting: *Deleted

## 2015-08-14 DIAGNOSIS — N390 Urinary tract infection, site not specified: Secondary | ICD-10-CM | POA: Insufficient documentation

## 2015-08-14 DIAGNOSIS — R109 Unspecified abdominal pain: Secondary | ICD-10-CM | POA: Insufficient documentation

## 2015-08-14 DIAGNOSIS — M791 Myalgia, unspecified site: Secondary | ICD-10-CM

## 2015-08-14 DIAGNOSIS — Z79899 Other long term (current) drug therapy: Secondary | ICD-10-CM | POA: Insufficient documentation

## 2015-08-14 DIAGNOSIS — F329 Major depressive disorder, single episode, unspecified: Secondary | ICD-10-CM | POA: Insufficient documentation

## 2015-08-14 DIAGNOSIS — W57XXXA Bitten or stung by nonvenomous insect and other nonvenomous arthropods, initial encounter: Secondary | ICD-10-CM

## 2015-08-14 DIAGNOSIS — M2550 Pain in unspecified joint: Secondary | ICD-10-CM

## 2015-08-14 DIAGNOSIS — R51 Headache: Secondary | ICD-10-CM | POA: Insufficient documentation

## 2015-08-14 DIAGNOSIS — F17210 Nicotine dependence, cigarettes, uncomplicated: Secondary | ICD-10-CM | POA: Insufficient documentation

## 2015-08-14 HISTORY — DX: Acute kidney failure, unspecified: N17.9

## 2015-08-14 HISTORY — DX: Sepsis, unspecified organism: A41.9

## 2015-08-14 LAB — COMPREHENSIVE METABOLIC PANEL
ALT: 15 U/L (ref 14–54)
AST: 21 U/L (ref 15–41)
Albumin: 3.8 g/dL (ref 3.5–5.0)
Alkaline Phosphatase: 74 U/L (ref 38–126)
Anion gap: 6 (ref 5–15)
BILIRUBIN TOTAL: 0.6 mg/dL (ref 0.3–1.2)
BUN: 8 mg/dL (ref 6–20)
CALCIUM: 9.2 mg/dL (ref 8.9–10.3)
CO2: 26 mmol/L (ref 22–32)
CREATININE: 0.75 mg/dL (ref 0.44–1.00)
Chloride: 108 mmol/L (ref 101–111)
GFR calc Af Amer: >60 mL/min (ref >60)
Glucose, Bld: 99 mg/dL (ref 65–99)
Potassium: 4 mmol/L (ref 3.5–5.1)
Sodium: 140 mmol/L (ref 135–145)
TOTAL PROTEIN: 6.6 g/dL (ref 6.5–8.1)

## 2015-08-14 LAB — URINALYSIS, ROUTINE W REFLEX MICROSCOPIC
Bilirubin Urine: NEGATIVE
GLUCOSE, UA: NEGATIVE mg/dL
Hgb urine dipstick: NEGATIVE
KETONES UR: NEGATIVE mg/dL
LEUKOCYTES UA: NEGATIVE
NITRITE: NEGATIVE
PROTEIN: NEGATIVE mg/dL
Specific Gravity, Urine: 1.012 (ref 1.005–1.030)
pH: 6.5 (ref 5.0–8.0)

## 2015-08-14 LAB — CBC
HCT: 41 % (ref 36.0–46.0)
Hemoglobin: 13.4 g/dL (ref 12.0–15.0)
MCH: 29.9 pg (ref 26.0–34.0)
MCHC: 32.7 g/dL (ref 30.0–36.0)
MCV: 91.5 fL (ref 78.0–100.0)
PLATELETS: 170 10*3/uL (ref 150–400)
RBC: 4.48 MIL/uL (ref 3.87–5.11)
RDW: 12.5 % (ref 11.5–15.5)
WBC: 4.2 10*3/uL (ref 4.0–10.5)

## 2015-08-14 LAB — LIPASE, BLOOD: Lipase: 34 U/L (ref 11–51)

## 2015-08-14 MED ORDER — SODIUM CHLORIDE 0.9 % IV BOLUS (SEPSIS)
1000.0000 mL | Freq: Once | INTRAVENOUS | Status: AC
  Administered 2015-08-14: 1000 mL via INTRAVENOUS

## 2015-08-14 MED ORDER — DOXYCYCLINE HYCLATE 100 MG PO CAPS: 100.0000 mg | ORAL_CAPSULE | Freq: Two times a day (BID) | ORAL | Status: DC

## 2015-08-14 NOTE — ED Notes (Signed)
Patient transported to Ultrasound 

## 2015-08-14 NOTE — ED Notes (Signed)
MD at bedside. 

## 2015-08-14 NOTE — ED Provider Notes (Signed)
CSN: [id number]     Arrival date & time 08/14/15  1603 History   First MD Initiated Contact with Patient 08/14/15 1926     Chief Complaint  Patient presents with  . Abdominal Pain  . Headache     (Consider location/radiation/quality/duration/timing/severity/associated sxs/prior Treatment) The history is provided by the patient.  Heather Hess is a 55 y.o. female history of UTI, herniated disc, here presenting with headaches, abdominal pain, fatigue, joint pain. About 3 weeks ago, she states that she had multiple tick bites while she was [place] the beach. She did not realize that she had ticks on her so remove them several days later. She denies any rash or fever. She states that she has intermittent headaches as well as some joint pain. She has some intermittent right flank pain radiating to the right groin as well as some suprapubic pain. She states that her urine is started than usual and this feels like her previous UTI. Denies any vomiting. Sent from urgent care for evaluation.    Past Medical History  Diagnosis Date  . Depression   . Herniated cervical disc   . Anxiety   . Chronic back pain   . Alcohol abuse   . Opiate abuse, episodic   . Septicemia (HCC)   . Acute kidney injury [facility])    Past Surgical History  Procedure Laterality Date  . Abdominal hysterectomy    . Cholecystectomy    . Tonsillectomy    . Hemorrhoid surgery     History reviewed. No pertinent family history. Social History  Substance Use Topics  . Smoking status: Current Every Day Smoker -- 0.25 packs/day for 10 years    Types: Cigarettes  . Smokeless tobacco: Never Used  . Alcohol Use: Yes     Comment: recovering alcoholic    OB History    No data available     Review of Systems  Genitourinary: Positive for flank pain.  Neurological: Positive for headaches.  All other systems reviewed and are negative.     Allergies  Nsaids  Home Medications   Prior to Admission medications   Medication  Sig Start Date End Date Taking? Authorizing Provider  busPIRone (BUSPAR) 5 MG tablet Take 1 tablet (5 mg total) by mouth 2 (two) times daily. 07/26/14  Yes Adonis Brook, NP  divalproex (DEPAKOTE) 250 MG DR tablet Take 250 mg by mouth 2 (two) times daily.   Yes Historical Provider, MD  DULoxetine (CYMBALTA) 60 MG capsule Take 1 capsule (60 mg total) by mouth 2 (two) times daily. 07/26/14  Yes Adonis Brook, NP  fluticasone [facility]) 50 MCG/ACT nasal spray Place 1-2 sprays into both nostrils daily as needed for allergies or rhinitis.   Yes Historical Provider, MD  gabapentin (NEURONTIN) 300 MG capsule Take 300 mg by mouth 2 (two) times daily as needed (for anxiety).    Yes Historical Provider, MD  cephALEXin (KEFLEX) 500 MG capsule Take 1 capsule (500 mg total) by mouth every 8 (eight) hours. Patient not taking: Reported on 08/14/2015 01/11/15   Marinda Elk, MD  diphenhydrAMINE (BENADRYL) 50 MG tablet Take 0.5 tablets (25 mg total) by mouth at bedtime as needed for itching. Patient not taking: Reported on 08/14/2015 01/11/15   Marinda Elk, MD  hydrOXYzine (ATARAX/VISTARIL) 25 MG tablet Take 1 tablet (25 mg total) by mouth every 6 (six) hours as needed for anxiety. Patient not taking: Reported on 08/14/2015 07/26/14   Adonis Brook, NP  nicotine (NICODERM CQ - DOSED IN  MG/24 HOURS) 21 mg/24hr patch Place 1 patch (21 mg total) onto the skin daily. Patient not taking: Reported on 08/14/2015 07/26/14   Adonis BrookSheila Agustin, NP  oxyCODONE-acetaminophen (PERCOCET/ROXICET) 5-325 MG tablet Take 2 tablets by mouth every 4 (four) hours as needed for moderate pain. Patient not taking: Reported on 08/14/2015 01/11/15   Marinda ElkAbraham Feliz Ortiz, MD  traZODone (DESYREL) 50 MG tablet Take 1 tablet (50 mg total) by mouth at bedtime and may repeat dose one time if needed. Patient not taking: Reported on 08/14/2015 07/26/14   Adonis BrookSheila Agustin, NP   BP 120/76 mmHg  Pulse 57  Temp(Src) 98.5 F (36.9 C) (Oral)  Resp 17   Wt 150 lb 12.8 oz (68.402 kg)  SpO2 100% Physical Exam  Constitutional: She is oriented to person, place, and time. She appears well-developed and well-nourished.  HENT:  Head: Normocephalic.  Mouth/Throat: Oropharynx is clear and moist.  Eyes: Conjunctivae are normal. Pupils are equal, round, and reactive to light.  Neck: Normal range of motion. Neck supple.  No meningeal signs   Cardiovascular: Normal rate, regular rhythm and normal heart sounds.   Pulmonary/Chest: Effort normal and breath sounds normal. No respiratory distress. She has no wheezes. She has no rales.  Abdominal: Soft. Bowel sounds are normal.  Mild R CVAT, mild suprapubic tenderness.   Musculoskeletal: Normal range of motion. She exhibits no edema or tenderness.  Neurological: She is alert and oriented to person, place, and time. No cranial nerve deficit. Coordination normal.  Skin: Skin is warm and dry.  Tick bite site with no obvious rash or erythema migraine or cellulitis. No petechial rash   Psychiatric: She has a normal mood and affect. Her behavior is normal. Judgment and thought content normal.  Nursing note and vitals reviewed.   ED Course  Procedures (including critical care time) Labs Review Labs Reviewed  URINE CULTURE  LIPASE, BLOOD  COMPREHENSIVE METABOLIC PANEL  CBC  URINALYSIS, ROUTINE W REFLEX MICROSCOPIC (NOT AT [facility])    Imaging Review [place] [facility]  08/14/2015  CLINICAL DATA:  Right groin and abdominal pain. EXAM: RENAL / URINARY TRACT ULTRASOUND COMPLETE COMPARISON:  CT of the abdomen and pelvis 01/08/2015 FINDINGS: Right Kidney: Length: 12.1 cm. Echogenicity within normal limits. No mass or hydronephrosis visualized. Left Kidney: Length: 11.5 cm. Echogenicity within normal limits. No mass or hydronephrosis visualized. Bladder: Appears normal for degree of bladder distention. IMPRESSION: Normal renal ultrasound. Electronically Signed   By: Ted Mcalpineobrinka  Dimitrova M.D.   On: 08/14/2015 21:01   I have  personally reviewed and evaluated these images and lab results as part of my medical decision-making.   EKG Interpretation None      MDM   Final diagnoses:  None   Jodi MarbleLora Avina is a 55 y.o. female here with flank pain, joint pain, headache after tick bite. Well appearing, afebrile, stable vitals. Consider pyelo vs stone vs UTI vs erhlichiosis. Will check labs, UA, Renal US.    9:31 PM Labs unremarkable. WBC nl. US unremarkable. UA nl. I think maybe viral syndrome vs erhlichiosis. Will dc home with doxycycline.   Richardean Canalavid H Jubal Rademaker, MD 08/14/15 2133

## 2015-08-14 NOTE — ED Notes (Signed)
Pt ambulatory to restroom independently with steady gait noted; pt given urine sample cup

## 2015-08-14 NOTE — ED Notes (Signed)
Pt sent here from ucc. Reports joint pain and headache for over two weeks. Also has right groin pain and into her abd. Hx of urosepsis. Also reports fatigue and recent tick bites.

## 2015-08-14 NOTE — ED Notes (Signed)
Pt c/o 2 weeks history joint aches, numbness and tingling in hands, fatigue, headaches, intermittent abd pain. Onset after removing a tick from her body. She has tried to increase oral intake and ibuprofen with no relief. She is alert and breathing easily

## 2015-08-14 NOTE — ED Provider Notes (Addendum)
CSN: [id number]     Arrival date & time 08/14/15  1502 History   First MD Initiated Contact with Patient 08/14/15 1526     Chief Complaint  Patient presents with  . Fatigue  . Generalized Body Aches   (Consider location/radiation/quality/duration/timing/severity/associated sxs/prior Treatment) Patient is a 55 y.o. female presenting with general illness. The history is provided by the patient.  Illness Duration:  2 weeks Chronicity:  New Context:  Headache, low gr fever, faigue, gen body aches, no rash, also abd pains, feels like 55yrs old. Associated symptoms: abdominal pain, fatigue, headaches and myalgias   Associated symptoms: no fever and no rash     Past Medical History  Diagnosis Date  . Depression   . Herniated cervical disc   . Anxiety   . Chronic back pain   . Alcohol abuse   . Opiate abuse, episodic   . Septicemia (HCC)   . Acute kidney injury [facility])    Past Surgical History  Procedure Laterality Date  . Abdominal hysterectomy    . Cholecystectomy    . Tonsillectomy    . Hemorrhoid surgery     History reviewed. No pertinent family history. Social History  Substance Use Topics  . Smoking status: Current Every Day Smoker -- 0.25 packs/day for 10 years    Types: Cigarettes  . Smokeless tobacco: Never Used  . Alcohol Use: Yes     Comment: recovering alcoholic    OB History    No data available     Review of Systems  Constitutional: Positive for fatigue. Negative for fever.  Gastrointestinal: Positive for abdominal pain.  Musculoskeletal: Positive for myalgias.  Skin: Negative for rash.  Neurological: Positive for weakness and headaches.  All other systems reviewed and are negative.   Allergies  Nsaids  Home Medications   Prior to Admission medications   Medication Sig Start Date End Date Taking? Authorizing Provider  divalproex (DEPAKOTE SPRINKLE) 125 MG capsule Take by mouth 2 (two) times daily.   Yes Historical Provider, MD  gabapentin  (NEURONTIN) 300 MG capsule Take 300 mg by mouth 3 (three) times daily.   Yes Historical Provider, MD  busPIRone (BUSPAR) 5 MG tablet Take 1 tablet (5 mg total) by mouth 2 (two) times daily. 07/26/14   Adonis Brook, NP  cephALEXin (KEFLEX) 500 MG capsule Take 1 capsule (500 mg total) by mouth every 8 (eight) hours. 01/11/15   Marinda Elk, MD  diphenhydrAMINE (BENADRYL) 50 MG tablet Take 0.5 tablets (25 mg total) by mouth at bedtime as needed for itching. 01/11/15   Marinda Elk, MD  DULoxetine (CYMBALTA) 60 MG capsule Take 1 capsule (60 mg total) by mouth 2 (two) times daily. 07/26/14   Adonis Brook, NP  hydrOXYzine (ATARAX/VISTARIL) 25 MG tablet Take 1 tablet (25 mg total) by mouth every 6 (six) hours as needed for anxiety. 07/26/14   Adonis Brook, NP  nicotine (NICODERM CQ - DOSED IN MG/24 HOURS) 21 mg/24hr patch Place 1 patch (21 mg total) onto the skin daily. 07/26/14   Adonis Brook, NP  oxyCODONE-acetaminophen (PERCOCET/ROXICET) 5-325 MG tablet Take 2 tablets by mouth every 4 (four) hours as needed for moderate pain. 01/11/15   Marinda Elk, MD  traZODone (DESYREL) 50 MG tablet Take 1 tablet (50 mg total) by mouth at bedtime and may repeat dose one time if needed. 07/26/14   Adonis Brook, NP   Meds Ordered and Administered this Visit  Medications - No data to display  BP 113/79  mmHg  Pulse 79  Temp(Src) 98.2 F (36.8 C) (Oral)  Resp 18  SpO2 96% No data found.   Physical Exam  Constitutional: She is oriented to person, place, and time. She appears well-developed and well-nourished. No distress.  Neck: Normal range of motion. Neck supple.  Cardiovascular: Normal heart sounds.   Pulmonary/Chest: Breath sounds normal.  Musculoskeletal: She exhibits tenderness. She exhibits no edema.  Lymphadenopathy:    She has no cervical adenopathy.  Neurological: She is alert and oriented to person, place, and time.  Skin: No rash noted.  Nursing note and vitals  reviewed.   ED Course  Procedures (including critical care time)  Labs Review Labs Reviewed - No data to display  Imaging Review No results found.   Visual Acuity Review  Right Eye Distance:   Left Eye Distance:   Bilateral Distance:    Right Eye Near:   Left Eye Near:    Bilateral Near:         MDM   1. Polyarthralgia    Sent for medical eval of mult complaints.    Linna HoffJames D Kindl, MD 08/14/15 1556  Linna HoffJames D Kindl, MD 08/14/15 [id number]

## 2015-08-14 NOTE — Discharge Instructions (Signed)
Take doxycycline twice daily for a week.   Take tylenol for pain.  See your doctor  Return to ER if you have worse myalgias, fever, vomiting, abdominal pain, trouble urinating

## 2015-08-16 LAB — URINE CULTURE: Culture: 60000 — AB

## 2016-01-27 ENCOUNTER — Emergency Department (HOSPITAL_COMMUNITY)
Admission: EM | Admit: 2016-01-27 | Discharge: 2016-01-28 | Disposition: A | Discharge status: Other Acute Inpatient Hospital | Payer: Federal, State, Local not specified - Other | Attending: Emergency Medicine | Admitting: Emergency Medicine

## 2016-01-27 DIAGNOSIS — Z79899 Other long term (current) drug therapy: Secondary | ICD-10-CM | POA: Insufficient documentation

## 2016-01-27 DIAGNOSIS — F314 Bipolar disorder, current episode depressed, severe, without psychotic features: Secondary | ICD-10-CM | POA: Diagnosis present

## 2016-01-27 DIAGNOSIS — R45851 Suicidal ideations: Secondary | ICD-10-CM

## 2016-01-27 DIAGNOSIS — F339 Major depressive disorder, recurrent, unspecified: Secondary | ICD-10-CM | POA: Insufficient documentation

## 2016-01-27 DIAGNOSIS — F10920 Alcohol use, unspecified with intoxication, uncomplicated: Secondary | ICD-10-CM

## 2016-01-27 DIAGNOSIS — F1024 Alcohol dependence with alcohol-induced mood disorder: Secondary | ICD-10-CM | POA: Diagnosis present

## 2016-01-27 DIAGNOSIS — F17210 Nicotine dependence, cigarettes, uncomplicated: Secondary | ICD-10-CM | POA: Insufficient documentation

## 2016-01-27 DIAGNOSIS — F10129 Alcohol abuse with intoxication, unspecified: Secondary | ICD-10-CM | POA: Insufficient documentation

## 2016-01-27 LAB — CBC
HCT: 40.5 % (ref 36.0–46.0)
HEMOGLOBIN: 13.4 g/dL (ref 12.0–15.0)
MCH: 31.2 pg (ref 26.0–34.0)
MCHC: 33.1 g/dL (ref 30.0–36.0)
MCV: 94.2 fL (ref 78.0–100.0)
Platelets: 246 10*3/uL (ref 150–400)
RBC: 4.3 MIL/uL (ref 3.87–5.11)
RDW: 13 % (ref 11.5–15.5)
WBC: 5.2 10*3/uL (ref 4.0–10.5)

## 2016-01-27 LAB — BASIC METABOLIC PANEL
Anion gap: 10 (ref 5–15)
BUN: 10 mg/dL (ref 6–20)
CHLORIDE: 105 mmol/L (ref 101–111)
CO2: 27 mmol/L (ref 22–32)
Calcium: 8.4 mg/dL — ABNORMAL LOW (ref 8.9–10.3)
Creatinine, Ser: 0.75 mg/dL (ref 0.44–1.00)
GFR calc Af Amer: >60 mL/min (ref >60)
GFR calc non Af Amer: >60 mL/min (ref >60)
GLUCOSE: 95 mg/dL (ref 65–99)
POTASSIUM: 4.1 mmol/L (ref 3.5–5.1)
Sodium: 142 mmol/L (ref 135–145)

## 2016-01-27 LAB — ETHANOL: Alcohol, Ethyl (B): 329 mg/dL (ref <5)

## 2016-01-27 LAB — SALICYLATE LEVEL: Salicylate Lvl: <7 mg/dL (ref 2.8–30.0)

## 2016-01-27 LAB — ACETAMINOPHEN LEVEL: Acetaminophen (Tylenol), Serum: <10 ug/mL — ABNORMAL LOW (ref 10–30)

## 2016-01-27 MED ORDER — ZIPRASIDONE MESYLATE 20 MG IM SOLR
20.0000 mg | Freq: Once | INTRAMUSCULAR | Status: AC
  Administered 2016-01-27: 20 mg via INTRAMUSCULAR

## 2016-01-27 MED ORDER — LIDOCAINE HCL (PF) 1 % IJ SOLN
2.0000 mL | Freq: Once | INTRAMUSCULAR | Status: AC
  Administered 2016-01-27: 2 mL

## 2016-01-27 NOTE — Progress Notes (Signed)
EDCM spoke to patient at bedside. Patient confirms she [place] a pcp [place]  [facility], informed patient of services there.  EDCM also provided patient with list of pcps who accept self pay patients, list of discount pharmacies and websites needymeds.org and GoodRX.com for medication assistance, phone number to inquire about the orange card, phone number to inquire about Medicaid, phone number to inquire about the Affordable Care Act, financial resources in the community such as local churches, [employer], urban ministries, and dental assistance for uninsured patients.  No further EDCM needs at this time.  This information was placed in patient's belongings bag in locker #29.

## 2016-01-27 NOTE — ED Notes (Signed)
Per MD order UDS. Unable to collect sample d/t pt asleep. Will attempt to collect once pt is awake. [employer]

## 2016-01-27 NOTE — ED Notes (Addendum)
Pt. Sweater, shoes, dentures, watch, vape, and jewelry including earrings, necklace, rings placed in pt belongings bag and secured in locker 29 in TCU area. RN advised. [employer]

## 2016-01-28 ENCOUNTER — Encounter (HOSPITAL_COMMUNITY): Payer: Self-pay | Admitting: *Deleted

## 2016-01-28 ENCOUNTER — Inpatient Hospital Stay (HOSPITAL_COMMUNITY)
Admission: AD | Admit: 2016-01-28 | Discharge: 2016-02-01 | DRG: 897 | Disposition: A | Discharge status: Home / Self Care | Payer: Federal, State, Local not specified - Other | Attending: Psychiatry | Admitting: Psychiatry

## 2016-01-28 DIAGNOSIS — F1020 Alcohol dependence, uncomplicated: Secondary | ICD-10-CM | POA: Diagnosis present

## 2016-01-28 DIAGNOSIS — F10229 Alcohol dependence with intoxication, unspecified: Secondary | ICD-10-CM | POA: Diagnosis present

## 2016-01-28 DIAGNOSIS — Z79899 Other long term (current) drug therapy: Secondary | ICD-10-CM

## 2016-01-28 DIAGNOSIS — R45851 Suicidal ideations: Secondary | ICD-10-CM | POA: Diagnosis present

## 2016-01-28 DIAGNOSIS — F17290 Nicotine dependence, other tobacco product, uncomplicated: Secondary | ICD-10-CM | POA: Diagnosis present

## 2016-01-28 DIAGNOSIS — Z888 Allergy status to other drugs, medicaments and biological substances status: Secondary | ICD-10-CM

## 2016-01-28 DIAGNOSIS — F1011 Alcohol abuse, in remission: Secondary | ICD-10-CM

## 2016-01-28 DIAGNOSIS — F314 Bipolar disorder, current episode depressed, severe, without psychotic features: Secondary | ICD-10-CM | POA: Diagnosis not present

## 2016-01-28 DIAGNOSIS — F332 Major depressive disorder, recurrent severe without psychotic features: Secondary | ICD-10-CM | POA: Diagnosis present

## 2016-01-28 DIAGNOSIS — F10239 Alcohol dependence with withdrawal, unspecified: Secondary | ICD-10-CM | POA: Diagnosis not present

## 2016-01-28 DIAGNOSIS — F331 Major depressive disorder, recurrent, moderate: Secondary | ICD-10-CM | POA: Diagnosis not present

## 2016-01-28 LAB — RAPID URINE DRUG SCREEN, HOSP PERFORMED
Amphetamines: NOT DETECTED
BARBITURATES: NOT DETECTED
BENZODIAZEPINES: NOT DETECTED
COCAINE: NOT DETECTED
Opiates: NOT DETECTED
TETRAHYDROCANNABINOL: NOT DETECTED

## 2016-01-28 LAB — ETHANOL: Alcohol, Ethyl (B): 176 mg/dL — ABNORMAL HIGH (ref <5)

## 2016-01-28 MED ORDER — BUSPIRONE HCL 10 MG PO TABS: 5.0000 mg | ORAL_TABLET | Freq: Two times a day (BID) | ORAL | Status: DC

## 2016-01-28 MED ORDER — VITAMIN B-1 100 MG PO TABS
100.0000 mg | ORAL_TABLET | Freq: Every day | ORAL | Status: DC
  Administered 2016-01-29 – 2016-02-01 (×4): 100 mg via ORAL
  Filled 2016-01-28 (×7): qty 1

## 2016-01-28 MED ORDER — HYDROXYZINE HCL 25 MG PO TABS
25.0000 mg | ORAL_TABLET | Freq: Four times a day (QID) | ORAL | Status: AC | PRN
  Administered 2016-01-28 – 2016-01-31 (×7): 25 mg via ORAL
  Filled 2016-01-28 (×8): qty 1

## 2016-01-28 MED ORDER — DIVALPROEX SODIUM 250 MG PO DR TAB: 250.0000 mg | DELAYED_RELEASE_TABLET | Freq: Two times a day (BID) | ORAL | Status: DC

## 2016-01-28 MED ORDER — ACETAMINOPHEN 325 MG PO TABS: 650.0000 mg | ORAL_TABLET | ORAL | Status: DC | PRN

## 2016-01-28 MED ORDER — LORAZEPAM 1 MG PO TABS
1.0000 mg | ORAL_TABLET | Freq: Three times a day (TID) | ORAL | Status: AC
  Administered 2016-01-29 – 2016-01-30 (×3): 1 mg via ORAL
  Filled 2016-01-28 (×3): qty 1

## 2016-01-28 MED ORDER — LORAZEPAM 1 MG PO TABS: 0.0000 mg | ORAL_TABLET | Freq: Four times a day (QID) | ORAL | Status: DC

## 2016-01-28 MED ORDER — MAGNESIUM HYDROXIDE 400 MG/5ML PO SUSP: 30.0000 mL | Freq: Every day | ORAL | Status: DC | PRN

## 2016-01-28 MED ORDER — MELOXICAM 7.5 MG PO TABS
15.0000 mg | ORAL_TABLET | Freq: Every day | ORAL | Status: DC
  Administered 2016-01-28 – 2016-02-01 (×5): 15 mg via ORAL
  Filled 2016-01-28: qty 14
  Filled 2016-01-28 (×6): qty 1

## 2016-01-28 MED ORDER — LORAZEPAM 1 MG PO TABS: 1.0000 mg | ORAL_TABLET | Freq: Four times a day (QID) | ORAL | Status: AC | PRN

## 2016-01-28 MED ORDER — ONDANSETRON 4 MG PO TBDP
4.0000 mg | ORAL_TABLET | Freq: Four times a day (QID) | ORAL | Status: AC | PRN
  Administered 2016-01-28 – 2016-01-31 (×4): 4 mg via ORAL
  Filled 2016-01-28 (×4): qty 1

## 2016-01-28 MED ORDER — LORAZEPAM 1 MG PO TABS
1.0000 mg | ORAL_TABLET | Freq: Every day | ORAL | Status: AC
  Administered 2016-02-01: 1 mg via ORAL
  Filled 2016-01-28: qty 1

## 2016-01-28 MED ORDER — MELOXICAM 15 MG PO TABS
15.0000 mg | ORAL_TABLET | Freq: Two times a day (BID) | ORAL | Status: DC
  Filled 2016-01-28 (×4): qty 1

## 2016-01-28 MED ORDER — VITAMIN B-1 100 MG PO TABS
100.0000 mg | ORAL_TABLET | Freq: Every day | ORAL | Status: DC
  Administered 2016-01-28: 100 mg via ORAL
  Filled 2016-01-28: qty 1

## 2016-01-28 MED ORDER — TRAZODONE HCL 50 MG PO TABS: 50.0000 mg | ORAL_TABLET | Freq: Every evening | ORAL | Status: DC | PRN

## 2016-01-28 MED ORDER — GABAPENTIN 300 MG PO CAPS: 300.0000 mg | ORAL_CAPSULE | Freq: Two times a day (BID) | ORAL | Status: DC | PRN

## 2016-01-28 MED ORDER — ZOLPIDEM TARTRATE 5 MG PO TABS: 5.0000 mg | ORAL_TABLET | Freq: Every evening | ORAL | Status: DC | PRN

## 2016-01-28 MED ORDER — DULOXETINE HCL 30 MG PO CPEP: 60.0000 mg | ORAL_CAPSULE | Freq: Two times a day (BID) | ORAL | Status: DC

## 2016-01-28 MED ORDER — TRAZODONE HCL 50 MG PO TABS
50.0000 mg | ORAL_TABLET | Freq: Every evening | ORAL | Status: DC | PRN
  Administered 2016-01-28: 50 mg via ORAL
  Filled 2016-01-28 (×2): qty 1

## 2016-01-28 MED ORDER — ONDANSETRON HCL 4 MG PO TABS
4.0000 mg | ORAL_TABLET | Freq: Three times a day (TID) | ORAL | Status: DC | PRN
Start: 2016-01-28 — End: 2016-01-28

## 2016-01-28 MED ORDER — ACETAMINOPHEN 325 MG PO TABS
650.0000 mg | ORAL_TABLET | Freq: Four times a day (QID) | ORAL | Status: DC | PRN
  Administered 2016-01-28 – 2016-02-01 (×9): 650 mg via ORAL
  Filled 2016-01-28 (×9): qty 2

## 2016-01-28 MED ORDER — THIAMINE HCL 100 MG/ML IJ SOLN: 100.0000 mg | Freq: Every day | INTRAMUSCULAR | Status: DC

## 2016-01-28 MED ORDER — ALUM & MAG HYDROXIDE-SIMETH 200-200-20 MG/5ML PO SUSP
30.0000 mL | ORAL | Status: DC | PRN
  Administered 2016-02-01: 30 mL via ORAL
  Filled 2016-01-28: qty 30

## 2016-01-28 MED ORDER — LOPERAMIDE HCL 2 MG PO CAPS: 2.0000 mg | ORAL_CAPSULE | ORAL | Status: AC | PRN

## 2016-01-28 MED ORDER — DIVALPROEX SODIUM 500 MG PO DR TAB
500.0000 mg | DELAYED_RELEASE_TABLET | Freq: Two times a day (BID) | ORAL | Status: DC
  Administered 2016-01-28 – 2016-02-01 (×9): 500 mg via ORAL
  Filled 2016-01-28: qty 14
  Filled 2016-01-28 (×6): qty 1
  Filled 2016-01-28: qty 14
  Filled 2016-01-28 (×5): qty 1

## 2016-01-28 MED ORDER — HYDROXYZINE HCL 25 MG PO TABS: 25.0000 mg | ORAL_TABLET | Freq: Four times a day (QID) | ORAL | Status: DC | PRN

## 2016-01-28 MED ORDER — DULOXETINE HCL 60 MG PO CPEP
60.0000 mg | ORAL_CAPSULE | Freq: Two times a day (BID) | ORAL | Status: DC
  Administered 2016-01-28 – 2016-02-01 (×9): 60 mg via ORAL
  Filled 2016-01-28 (×2): qty 1
  Filled 2016-01-28: qty 14
  Filled 2016-01-28 (×2): qty 1
  Filled 2016-01-28: qty 14
  Filled 2016-01-28 (×6): qty 1

## 2016-01-28 MED ORDER — NICOTINE POLACRILEX 2 MG MT GUM
2.0000 mg | CHEWING_GUM | OROMUCOSAL | Status: DC | PRN
  Administered 2016-01-28: 2 mg via ORAL
  Filled 2016-01-28: qty 1

## 2016-01-28 MED ORDER — LORAZEPAM 1 MG PO TABS: 0.0000 mg | ORAL_TABLET | Freq: Two times a day (BID) | ORAL | Status: DC

## 2016-01-28 MED ORDER — LORAZEPAM 1 MG PO TABS
1.0000 mg | ORAL_TABLET | Freq: Two times a day (BID) | ORAL | Status: AC
  Administered 2016-01-30 – 2016-01-31 (×2): 1 mg via ORAL
  Filled 2016-01-28 (×2): qty 1

## 2016-01-28 MED ORDER — THIAMINE HCL 100 MG/ML IJ SOLN
100.0000 mg | Freq: Once | INTRAMUSCULAR | Status: AC
  Administered 2016-01-28: 100 mg via INTRAMUSCULAR
  Filled 2016-01-28: qty 2

## 2016-01-28 MED ORDER — ADULT MULTIVITAMIN W/MINERALS CH
1.0000 | ORAL_TABLET | Freq: Every day | ORAL | Status: DC
  Administered 2016-01-28 – 2016-02-01 (×5): 1 via ORAL
  Filled 2016-01-28 (×7): qty 1

## 2016-01-28 MED ORDER — LORAZEPAM 1 MG PO TABS
1.0000 mg | ORAL_TABLET | Freq: Four times a day (QID) | ORAL | Status: AC
  Administered 2016-01-28 – 2016-01-29 (×4): 1 mg via ORAL
  Filled 2016-01-28 (×5): qty 1

## 2016-01-28 NOTE — ED Notes (Signed)
TTS at bedside. 
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
## 2016-01-28 NOTE — Progress Notes (Signed)
Skin Check:  Bruises L lower leg, 4 healed fingernail scratches on R middle back, tattoo on R lower abdomen

## 2016-01-28 NOTE — ED Notes (Signed)
[facility] was contacted to transport patient
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
## 2016-01-28 NOTE — Tx Team (Addendum)
Initial Treatment Plan 01/28/2016 4:26 PM Heather MarbleLora Rookstool EAV:[id number]    PATIENT STRESSORS: Loss of signifcant other Medication change or noncompliance Substance abuse   PATIENT STRENGTHS: Ability for insight Active sense of humor Capable of independent living General fund of knowledge Motivation for treatment/growth   PATIENT IDENTIFIED PROBLEMS: Alcohol abuse want to detox  Poor coping wants to make plan for improvement  Medication non compliance wants to be compliant for improvement of depression  "i'm trying to get sober"  "I need to fix things with my boyfriend"             DISCHARGE CRITERIA:  Ability to meet basic life and health needs Adequate post-discharge living arrangements Improved stabilization in mood, thinking, and/or behavior Motivation to continue treatment in a less acute level of care Withdrawal symptoms are absent or subacute and managed without 24-hour nursing intervention  PRELIMINARY DISCHARGE PLAN: Attend aftercare/continuing care group Attend 12-step recovery group Outpatient therapy Return to previous living arrangement  PATIENT/FAMILY INVOLVEMENT: This treatment plan has been presented to and reviewed with the patient, Heather MarbleLora Burggraf, and/or family member, .  The patient and family have been given the opportunity to ask questions and make suggestions.  Carlisle CaterErika C Cano, RN 01/28/2016, 4:26 PM

## 2016-01-28 NOTE — BH Assessment (Signed)
[facility] Assessment Progress Note  Per Thedore MinsMojeed Akintayo, MD, this pt requires psychiatric hospitalization.  Lillia AbedLindsay, RN, [id number].  Pt presents under IVC initiated by law enforcement, and upheld by Dr Jannifer FranklinAkintayo, and [facility]  Pt's nurse, Verlon AuLeslie, has been notified, and agrees to call report to [phone].  Pt is to be transported via [employer] examinerlaw enforcement.   Doylene Canninghomas Esmae Donathan, MA Triage Specialist [phone]

## 2016-01-28 NOTE — [facility] Suicide Risk Assessment (Signed)
[facility]   Nursing information obtained from:    Demographic factors:    Current Mental Status:    Loss Factors:    Historical Factors:    Risk Reduction Factors:     Total Time spent with patient: 30 minutes Principal Problem: Alcohol use disorder, severe, dependence ([facility]) Diagnosis:   Patient Active Problem List   Diagnosis Date Noted  . Alcohol use disorder, severe, dependence ([facility]) [F10.20] 01/28/2016  . EtOH dependence ([facility]) [F10.20] 01/28/2016  . Sepsis due to Escherichia coli (E. coli) ([facility]) [A41.51] 01/08/2015  . Anxiety [F41.9] 01/08/2015  . AKI (acute kidney injury) ([facility]) [N17.9] 01/08/2015  . Alcohol dependence with alcohol-induced mood disorder ([facility]) [F10.24]   . Alcohol intoxication ([facility]) [F10.929]   . Suicidal thoughts [R45.851]    Subjective Data: A she denies current suicidal or homicidal ideation, plan or intent.  Continued Clinical Symptoms:    The "Alcohol Use Disorders Identification Test", Guidelines for Use in Primary Care, Second Edition.  World Science [employer] [facility](WHO). Score between 0-7:  no or low risk or alcohol related problems. Score between 8-15:  moderate risk of alcohol related problems. Score between 16-19:  high risk of alcohol related problems. Score 20 or above:  warrants further diagnostic evaluation for alcohol dependence and treatment.   CLINICAL FACTORS:   Alcohol/Substance Abuse/Dependencies   Musculoskeletal: Strength & Muscle Tone: within normal limits Gait & Station: normal Patient leans: N/A  Psychiatric Specialty Exam: Physical Exam  ROS  Blood pressure 137/79, pulse 90, temperature 99.8 F (37.7 C), temperature source Oral, resp. rate 18, height 5\' 8"  (1.727 m), weight 73.9 kg (163 lb), SpO2 99 %.Body mass index is 24.78 kg/m.   General Appearance: Disheveled  Eye Contact:  Good  Speech:  Slightly rapid  Volume:  Normal  Mood:  Anxious  Affect:  Congruent  Thought Process:  Coherent   Orientation:  Negative  Thought Content:  Negative  Suicidal Thoughts:  No  Homicidal Thoughts:  No  Memory:  Negative  Judgement:  Fair  Insight:  Present  Psychomotor Activity:  Normal  Concentration:  Concentration: Fair  Recall:  Fair  Fund of Knowledge:  Good  Language:  Good  Akathisia:  No  Handed:  Right  AIMS (if indicated):     Assets:  Resilience  ADL's:  Intact  Cognition:  WNL  Sleep:        COGNITIVE FEATURES THAT CONTRIBUTE TO RISK:  None    SUICIDE RISK:   Mild:  Suicidal ideation of limited frequency, intensity, duration, and specificity.  There are no identifiable plans, no associated intent, mild dysphoria and related symptoms, good self-control (both objective and subjective assessment), few other risk factors, and identifiable protective factors, including available and accessible social support.   PLAN OF CARE: see PAA  I certify that inpatient services furnished can reasonably be expected to improve the patient's condition.  Acquanetta SitElizabeth Woods Jaksen Fiorella, MD 01/28/2016, 2:07 PM

## 2016-01-28 NOTE — ED Notes (Signed)
Report given to Penny, RN

## 2016-01-28 NOTE — ED Provider Notes (Signed)
[facility]-EMERGENCY DEPT Provider Note   CSN: [id number] Arrival date & time: 01/27/16  2042     History   Chief Complaint Chief Complaint  Patient presents with  . IVC  . Suicidal    Level V caveat: Mental illness, intoxication  HPI Heather Hess is a 55 y.o. female.  HPI Police were called to the patient's residence where she was found intoxicated care and I saying she was going to kill her self.  She lost her son 15 years ago and is still depressed about this per family at scene.  Patient's been drinking heavily over the past several days per bystanders as reported to me.  Patient brought in by handcuffs swearing and obviously intoxicated.   Past Medical History:  Diagnosis Date  . Acute kidney injury (HCC)   . Alcohol abuse   . Anxiety   . Chronic back pain   . Depression   . Herniated cervical disc   . Opiate abuse, episodic   . Septicemia [facility](HCC)     Patient Active Problem List   Diagnosis Date Noted  . Sepsis due to Escherichia coli (E. coli) (HCC) 01/08/2015  . Pyelonephritis 01/08/2015  . Anxiety 01/08/2015  . History of alcohol abuse 01/08/2015  . AKI (acute kidney injury) (HCC) 01/08/2015  . Alcohol dependence with alcohol-induced mood disorder (HCC)   . Bipolar 1 disorder, depressed, severe (HCC) 07/21/2014  . Alcohol intoxication (HCC)   . Suicidal thoughts   . Major depressive disorder, recurrent, severe without psychotic features [facility](HCC)     Past Surgical History:  Procedure Laterality Date  . ABDOMINAL HYSTERECTOMY    . CHOLECYSTECTOMY    . HEMORRHOID SURGERY    . TONSILLECTOMY      OB History    No data available       Home Medications    Prior to Admission medications   Medication Sig Start Date End Date Taking? Authorizing Provider  busPIRone (BUSPAR) 5 MG tablet Take 1 tablet (5 mg total) by mouth 2 (two) times daily. 07/26/14   Adonis BrookSheila Agustin, NP  cephALEXin (KEFLEX) 500 MG capsule Take 1 capsule (500 mg total) by mouth every 8  (eight) hours. Patient not taking: Reported on 08/14/2015 01/11/15   Marinda ElkAbraham Feliz Ortiz, MD  diphenhydrAMINE (BENADRYL) 50 MG tablet Take 0.5 tablets (25 mg total) by mouth at bedtime as needed for itching. Patient not taking: Reported on 08/14/2015 01/11/15   Marinda ElkAbraham Feliz Ortiz, MD  divalproex (DEPAKOTE) 250 MG DR tablet Take 250 mg by mouth 2 (two) times daily.    Historical Provider, MD  doxycycline (VIBRAMYCIN) 100 MG capsule Take 1 capsule (100 mg total) by mouth 2 (two) times daily. One po bid x 7 days 08/14/15   Charlynne Panderavid Hsienta Yao, MD  DULoxetine (CYMBALTA) 60 MG capsule Take 1 capsule (60 mg total) by mouth 2 (two) times daily. 07/26/14   Adonis BrookSheila Agustin, NP  fluticasone [facility](FLONASE) 50 MCG/ACT nasal spray Place 1-2 sprays into both nostrils daily as needed for allergies or rhinitis.    Historical Provider, MD  gabapentin (NEURONTIN) 300 MG capsule Take 300 mg by mouth 2 (two) times daily as needed (for anxiety).     Historical Provider, MD  hydrOXYzine (ATARAX/VISTARIL) 25 MG tablet Take 1 tablet (25 mg total) by mouth every 6 (six) hours as needed for anxiety. Patient not taking: Reported on 08/14/2015 07/26/14   Adonis BrookSheila Agustin, NP  nicotine (NICODERM CQ - DOSED IN MG/24 HOURS) 21 mg/24hr patch Place 1 patch (21 mg  total) onto the skin daily. Patient not taking: Reported on 08/14/2015 07/26/14   Adonis BrookSheila Agustin, NP  oxyCODONE-acetaminophen (PERCOCET/ROXICET) 5-325 MG tablet Take 2 tablets by mouth every 4 (four) hours as needed for moderate pain. Patient not taking: Reported on 08/14/2015 01/11/15   Marinda ElkAbraham Feliz Ortiz, MD  traZODone (DESYREL) 50 MG tablet Take 1 tablet (50 mg total) by mouth at bedtime and may repeat dose one time if needed. Patient not taking: Reported on 08/14/2015 07/26/14   Adonis BrookSheila Agustin, NP    Family History No family history on file.  Social History Social History  Substance Use Topics  . Smoking status: Current Every Day Smoker    Packs/day: 0.25    Years: 10.00     Types: Cigarettes  . Smokeless tobacco: Never Used  . Alcohol use Yes     Comment: recovering alcoholic      Allergies   Nsaids   Review of Systems Review of Systems  Unable to perform ROS: Mental status change     Physical Exam Updated Vital Signs BP 107/58 (BP Location: Right Arm)   Pulse 95   Temp 98 F (36.7 C) (Oral)   Resp 14   SpO2 93%   Physical Exam  Constitutional: She appears well-developed and well-nourished. No distress.  HENT:  Head: Normocephalic and atraumatic.  Eyes: EOM are normal.  Neck: Normal range of motion.  Cardiovascular: Normal rate, regular rhythm and normal heart sounds.   Pulmonary/Chest: Effort normal and breath sounds normal.  Abdominal: Soft. She exhibits no distension. There is no tenderness.  Musculoskeletal: Normal range of motion.  Neurological:  Alert.  Follows simple commands.  Skin: Skin is warm and dry.  Psychiatric:  Depressed and tearful.  Crying.  Swearing.  Nursing note and vitals reviewed.    ED Treatments / Results  Labs (all labs ordered are listed, but only abnormal results are displayed) Labs Reviewed  BASIC METABOLIC PANEL - Abnormal; Notable for the following:       Result Value   Calcium 8.4 (*)    All other components within normal limits  ETHANOL - Abnormal; Notable for the following:    Alcohol, Ethyl (B) 329 (*)    All other components within normal limits  ACETAMINOPHEN LEVEL - Abnormal; Notable for the following:    Acetaminophen (Tylenol), Serum <10 (*)    All other components within normal limits  CBC  SALICYLATE LEVEL  RAPID URINE DRUG SCREEN, HOSP PERFORMED    EKG  EKG Interpretation None       Radiology No results found.  Procedures Procedures (including critical care time)  Medications Ordered in ED Medications  ziprasidone (GEODON) injection 20 mg (20 mg Intramuscular Given 01/27/16 2105)  lidocaine (PF) (XYLOCAINE) 1 % injection 2 mL (2 mLs Other Given 01/27/16 2105)      Initial Impression / Assessment and Plan / ED Course  I have reviewed the triage vital signs and the nursing notes.  Pertinent labs & imaging results that were available during my care of the patient were reviewed by me and considered in my medical decision making (see chart for details).  Clinical Course     Patient given Geodon and put in 4. restraints for patient and staff safety as she was swearing and swinging her arms.  She was kicking.  She is extremely intoxicated here in the emergency department.  She will need to be evaluated by TTS in the morning time  Final Clinical Impressions(s) / ED Diagnoses  Final diagnoses:  None    New Prescriptions New Prescriptions   No medications on file     Azalia Bilis, MD 01/28/16 [phone]
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
## 2016-01-28 NOTE — [facility] Suicide Risk Assessment (Signed)
[facility] INPATIENT:  Family/Significant Other Suicide Prevention Education  Suicide Prevention Education:  Patient Refusal for Family/Significant Other Suicide Prevention Education: The patient Heather Hess has refused to provide written consent for family/significant other to be provided Family/Significant Other Suicide Prevention Education during admission and/or prior to discharge.  Physician notified.  SPE completed with pt, as pt refused to consent to family contact. SPI pamphlet provided to pt and pt was encouraged to share information with support network, ask questions, and talk about any concerns relating to SPE. Pt denies access to guns/firearms and verbalized understanding of information provided. Mobile Crisis information also provided to pt.   Laqueena Hinchey N Smart LCSW 01/28/2016, 2:30 PM
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
## 2016-01-29 LAB — VITAMIN B12: VITAMIN B 12: 327 pg/mL (ref 180–914)

## 2016-01-29 LAB — TSH: TSH: 2.479 u[IU]/mL (ref 0.350–4.500)

## 2016-01-29 MED ORDER — NICOTINE 21 MG/24HR TD PT24
21.0000 mg | MEDICATED_PATCH | Freq: Every day | TRANSDERMAL | Status: DC
  Administered 2016-01-29 – 2016-02-01 (×4): 21 mg via TRANSDERMAL
  Filled 2016-01-29 (×5): qty 1

## 2016-01-29 MED ORDER — TRAZODONE HCL 50 MG PO TABS
50.0000 mg | ORAL_TABLET | Freq: Every evening | ORAL | Status: DC | PRN
  Administered 2016-01-29 – 2016-01-31 (×5): 50 mg via ORAL
  Filled 2016-01-29: qty 1
  Filled 2016-01-29: qty 14
  Filled 2016-01-29 (×4): qty 1

## 2016-01-29 NOTE — Progress Notes (Signed)
[facility] Group Notes:  (Nursing/MHT/Case Management/Adjunct)  Date:  01/29/2016  Time:  12:09 AM  Type of Therapy:  Psychoeducational Skills  Participation Level:  Active  Participation Quality:  Attentive  Affect:  Appropriate  Cognitive:  Appropriate  Insight:  Appropriate  Engagement in Group:  Engaged  Modes of Intervention:  Education  Summary of Progress/Problems: The patient expressed in group that she had a good day overall and that she was able to identify some of her bad behaviors. Her goal for tomorrow is to feel better, eat more, and call more people that are supportive of her.   [name] 01/29/2016, 12:09 AM

## 2016-01-29 NOTE — Progress Notes (Signed)
Recreation Therapy Notes  Date: 01/29/16 Time: 0930 Location: [address] Hall Dayroom  Group Topic: Stress Management  Goal Area(s) Addresses:  Patient will verbalize importance of using healthy stress management.  Patient will identify positive emotions associated with healthy stress management.   Intervention: Stress Management  Activity :  Guided Imagery.  LRT introduced the stress management technique of guided imagery.  LRT read a script so patients could experience the concept of guided imagery.  Patients were to follow along as LRT read script to engage in activity.  Education:  Stress Management, Discharge Planning.   Education Outcome: Acknowledges edcuation/In group clarification offered/Needs additional education  Clinical Observations/Feedback: Pt did not attend group.   Aubreigh Fuerte, LRT/CTRS         Daniqua Campoy A 01/29/2016 11:51 AM 

## 2016-01-29 NOTE — Progress Notes (Signed)
Nursing Progress Note: 7p-7a D: Pt currently presents with a anxious/childlike/animated affect and behavior. Pt states "I wish I could get two 50 mg doses of Trazodone at bedtime. I can't sleep without it." Pt reports bad sleep with current medication regimen.   A: Pt provided with medications per providers orders. Pt's labs and vitals were monitored throughout the night. Pt supported emotionally and encouraged to express concerns and questions. Pt educated on medications.  R: Pt's safety ensured with 15 minute and environmental checks. Pt currently denies SI/HI/Self Harm and A/V hallucinations. Pt verbally agrees to seek staff if SI/HI or A/VH occurs and to consult with staff before acting on any harmful thoughts. Will continue POC.

## 2016-01-29 NOTE — Progress Notes (Signed)
[facility] MD Progress Note  01/29/2016 10:30 AM  Patient Active Problem List   Diagnosis Date Noted  . Alcohol use disorder, severe, dependence (HCC) 01/28/2016  . EtOH dependence (HCC) 01/28/2016  . Sepsis due to Escherichia coli (E. coli) (HCC) 01/08/2015  . Anxiety 01/08/2015  . AKI (acute kidney injury) (HCC) 01/08/2015  . Alcohol dependence with alcohol-induced mood disorder (HCC)   . Alcohol intoxication (HCC)   . Suicidal thoughts     Diagnosis: Alcohol use disorder, severe with withdrawal uncomplicated  Subjective: Patient reports she still feels quite stiff and sore which she attributes to fighting with the police and against her restraints in the emergency room prior to admission. She does agree currently to sign in as a voluntary patient. She denies any current suicidal or homicidal ideation, plan or intent or any complications from alcohol withdrawal.  Objective: Well-developed well-nourished woman who does move rather stiffly as she arises from bed and walks down the hall mood is all right affect is mildly anxious thought processes linear and goal-directed thought content no current suicidal or homicidal ideation, plan or intent no auditory or visual hallucinations alert and oriented, insight and judgment are fair IQ appears an average range         Current Facility-Administered Medications (Analgesics):  .  acetaminophen (TYLENOL) tablet 650 mg .  meloxicam (MOBIC) tablet 15 mg     Current Facility-Administered Medications (Other):  .  alum & mag hydroxide-simeth (MAALOX/MYLANTA) 200-200-20 MG/5ML suspension 30 mL .  divalproex (DEPAKOTE) DR tablet 500 mg .  DULoxetine (CYMBALTA) DR capsule 60 mg .  hydrOXYzine (ATARAX/VISTARIL) tablet 25 mg .  loperamide (IMODIUM) capsule 2-4 mg .  LORazepam (ATIVAN) tablet 1 mg .  [COMPLETED] LORazepam (ATIVAN) tablet 1 mg **FOLLOWED BY** LORazepam (ATIVAN) tablet 1 mg **FOLLOWED BY** [START ON 01/30/2016] LORazepam (ATIVAN) tablet  1 mg **FOLLOWED BY** [START ON 02/01/2016] LORazepam (ATIVAN) tablet 1 mg .  magnesium hydroxide (MILK OF MAGNESIA) suspension 30 mL .  multivitamin with minerals tablet 1 tablet .  nicotine polacrilex (NICORETTE) gum 2 mg .  ondansetron (ZOFRAN-ODT) disintegrating tablet 4 mg .  thiamine (VITAMIN B-1) tablet 100 mg .  traZODone (DESYREL) tablet 50 mg  No current outpatient prescriptions on file.  Vital Signs:Blood pressure 128/86, pulse 87, temperature 97.8 F (36.6 C), temperature source Oral, resp. rate 18, height 5\' 8"  (1.727 m), weight 73.9 kg (163 lb), SpO2 99 %.    Lab Results:  Results for orders placed or performed during the hospital encounter of 01/28/16 (from the past 48 hour(s))  TSH     Status: None   Collection Time: 01/29/16  6:12 AM  Result Value Ref Range   TSH 2.479 0.350 - 4.500 uIU/mL    Comment: Performed by a 3rd Generation assay with a functional sensitivity of <=0.01 uIU/mL. Performed at [facility]   Vitamin B12     Status: None   Collection Time: 01/29/16  6:12 AM  Result Value Ref Range   Vitamin B-12 327 180 - 914 pg/mL    Comment: (NOTE) This assay is not validated for testing neonatal or myeloproliferative syndrome specimens for Vitamin B12 levels. [facility]formed [facility]     Physical Findings: AIMS: Facial and Oral Movements Muscles of Facial Expression: None, normal Lips and Perioral Area: None, normal Jaw: None, normal Tongue: None, normal,Extremity Movements Upper (arms, wrists, hands, fingers): None, normal Lower (legs, knees, ankles, toes): None, normal, Trunk Movements Neck, shoulders, hips: None, normal,  Overall Severity Severity of abnormal movements (highest score from questions above): None, normal Incapacitation due to abnormal movements: None, normal Patient's awareness of abnormal movements (rate only patient's report): No Awareness, Dental Status Current problems with teeth and/or dentures?:  Yes Does patient usually wear dentures?: Yes  CIWA:  CIWA-Ar Total: 6 COWS:      Assessment/Plan: Patient appears to be progressing with her detoxification without incident. She is tolerating her current medications well. She has been meeting with the social worker and is considering outpatient follow-up for further treatment of her substance use issues. We will continue to monitor her progress with targeted discharge early next week.  Acquanetta SitElizabeth Woods Deven Audi, MD 01/29/2016, 10:30 AM

## 2016-01-29 NOTE — Progress Notes (Signed)
Patient attended AA group meeting.  
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
## 2016-01-29 NOTE — [facility] Group Notes (Signed)
[facility] LCSW Group Therapy  01/29/2016 1:40 PM  Type of Therapy:  Group Therapy  Participation Level:  Did Not Attend-pt invited. Chose to rest in room.   Summary of Progress/Problems: Feelings around Relapse. Group members discussed the meaning of relapse and shared personal stories of relapse, how it affected them and others, and how they perceived themselves during this time. Group members were encouraged to identify triggers, warning signs and coping skills used when facing the possibility of relapse. Social supports were discussed and explored in detail. Post Acute Withdrawal Syndrome (handout provided) was introduced and examined. Pt's were encouraged to ask questions, talk about key points associated with PAWS, and process this information in terms of relapse prevention.   Eudora Guevarra N Smart LCSW 01/29/2016, 1:40 PM

## 2016-01-30 DIAGNOSIS — F331 Major depressive disorder, recurrent, moderate: Secondary | ICD-10-CM

## 2016-01-30 DIAGNOSIS — F10239 Alcohol dependence with withdrawal, unspecified: Secondary | ICD-10-CM

## 2016-01-30 NOTE — Progress Notes (Signed)
Data. Patient denies SI/HI/AVH. Patient has been isolating in her room most of shift, napping. She has a very flat affect, and she does not brighten with interaction. Patient reports continued S/S of withdrawal and has received PRNs as appropriate. On here self assessment she reports 3-4/10 for depression, 0/10 for hopelessness and 8/10 for anxiety. Her goal is: "Setting goals for my future".  Action. Emotional support and encouragement offered. Education provided on medication, indications and side effect. Q 15 minute checks done for safety. Response. Safety on the unit maintained through 15 minute checks.  Medications taken as prescribed. Did not attended groups. Remained calm and appropriate through out shift.

## 2016-01-30 NOTE — [facility] Group Notes (Signed)
[facility] Group Notes:  (Nursing/MHT/Case Management/Adjunct)  Date:  01/30/2016  Time:  12:35 PM  Type of Therapy:  Nurse Education  Participation Level:  Did Not Attend   Almira Barenny G Kumari Sculley 01/30/2016, 12:35 PM

## 2016-01-30 NOTE — Progress Notes (Signed)
Nursing Progress Note: 7p-7a D: Pt currently presents with a anxious affect and behavior. Pt reports to writer that their goal is to "prove to myself that I can beat my addiction." Pt states "I am so lucky to be here. I feel and look so much better. My relationship with my boyfriend is looking up, and I can't wait to prove that I can do this." Pt reports ok sleep with current medication regimen.   A: Pt provided with medications per providers orders. Pt's labs and vitals were monitored throughout the night. Pt supported emotionally and encouraged to express concerns and questions. Pt educated on medications.  R: Pt's safety ensured with 15 minute and environmental checks. Pt currently denies SI/HI/Self Harm and A/V hallucinations. Pt verbally agrees to seek staff if SI/HI or A/VH occurs and to consult with staff before acting on any harmful thoughts. Will continue POC.

## 2016-01-30 NOTE — [facility] Group Notes (Signed)
Adult Psychoeducational Group Note  Date:  01/30/2016 Time:  4:25 PM  Group Topic/Focus:  Identifying Needs:   The focus of this group is to help patients identify their personal needs that have been historically problematic and identify healthy behaviors to address their needs.   Participation Level:  Minimal  Participation Quality:  Drowsy  Affect:  Flat  Cognitive:  Appropriate  Insight: Appropriate  Engagement in Group:  Limited  Modes of Intervention:  Discussion and Rapport Building  Additional Comments:  Pt sat quietly during group, did not participate verbally but was respectful to the group.  Lauris Poagndrea B Nihira Puello 01/30/2016, 4:25 PM

## 2016-01-30 NOTE — [facility] Group Notes (Signed)
[facility] Group Notes: (Clinical Social Work)   01/30/2016      Type of Therapy:  Group Therapy   Participation Level:  Did Not Attend despite MHT prompting   Corrina Steffensen Grossman-Orr, LCSW 01/30/2016, 12:49 PM     

## 2016-01-30 NOTE — Plan of Care (Signed)
Problem: Education: Goal: Knowledge of the prescribed therapeutic regimen will improve Outcome: Progressing Patient wa able to take her medications as ordered this shift.

## 2016-01-30 NOTE — Progress Notes (Signed)
Patient did attend the evening speaker AA meeting.  

## 2016-01-30 NOTE — Progress Notes (Signed)
[facility]  01/30/2016 11:16 AM  Patient Active Problem List   Diagnosis Date Noted  . Alcohol use disorder, severe, dependence (HCC) 01/28/2016  . EtOH dependence (HCC) 01/28/2016  . Sepsis due to Escherichia coli (E. coli) (HCC) 01/08/2015  . Anxiety 01/08/2015  . AKI (acute kidney injury) (HCC) 01/08/2015  . Alcohol dependence with alcohol-induced mood disorder (HCC)   . Alcohol intoxication (HCC)   . Suicidal thoughts     Diagnosis: Alcohol use disorder, severe with withdrawal uncomplicated Subjective: alert, slept reasonable. Feels less stiff . Tolerating detox and shows cooperation with compliance  Objective: well developed and nourishted. Feels to have better insight and depression improving. Tolerating detox.  Mood not depressed, somewhat anxious Speech clear and coherent. Insight improving Memory fair No delusions. behaviour cooperative Eye contact good No psychotic symptoms.  No mania Not in DT          Current Facility-Administered Medications (Analgesics):  .  acetaminophen (TYLENOL) tablet 650 mg .  meloxicam (MOBIC) tablet 15 mg     Current Facility-Administered Medications (Other):  .  alum & mag hydroxide-simeth (MAALOX/MYLANTA) 200-200-20 MG/5ML suspension 30 mL .  divalproex (DEPAKOTE) DR tablet 500 mg .  DULoxetine (CYMBALTA) DR capsule 60 mg .  hydrOXYzine (ATARAX/VISTARIL) tablet 25 mg .  loperamide (IMODIUM) capsule 2-4 mg .  LORazepam (ATIVAN) tablet 1 mg .  [COMPLETED] LORazepam (ATIVAN) tablet 1 mg **FOLLOWED BY** [COMPLETED] LORazepam (ATIVAN) tablet 1 mg **FOLLOWED BY** LORazepam (ATIVAN) tablet 1 mg **FOLLOWED BY** [START ON 02/01/2016] LORazepam (ATIVAN) tablet 1 mg .  magnesium hydroxide (MILK OF MAGNESIA) suspension 30 mL .  multivitamin with minerals tablet 1 tablet .  nicotine (NICODERM CQ - dosed in mg/24 hours) patch 21 mg .  ondansetron (ZOFRAN-ODT) disintegrating tablet 4 mg .  thiamine (VITAMIN B-1) tablet 100 mg .   traZODone (DESYREL) tablet 50 mg  No current outpatient prescriptions on file.  Vital Signs:Blood pressure 106/87, pulse 84, temperature 97.5 F (36.4 C), resp. rate 18, height 5\' 8"  (1.727 m), weight 73.9 kg (163 lb), SpO2 99 %.    Lab Results:  Results for orders placed or performed during the hospital encounter of 01/28/16 (from the past 48 hour(s))  TSH     Status: None   Collection Time: 01/29/16  6:12 AM  Result Value Ref Range   TSH 2.479 0.350 - 4.500 uIU/mL    Comment: Performed by a 3rd Generation assay with a functional sensitivity of <=0.01 uIU/mL. Performed at [facility]   Vitamin B12     Status: None   Collection Time: 01/29/16  6:12 AM  Result Value Ref Range   Vitamin B-12 327 180 - 914 pg/mL    Comment: (NOTE) This assay is not validated for testing neonatal or myeloproliferative syndrome specimens for Vitamin B12 levels. Performed at [facility]     Physical Findings: AIMS: Facial and Oral Movements Muscles of Facial Expression: None, normal Lips and Perioral Area: None, normal Jaw: None, normal Tongue: None, normal,Extremity Movements Upper (arms, wrists, hands, fingers): None, normal Lower (legs, knees, ankles, toes): None, normal, Trunk Movements Neck, shoulders, hips: None, normal, Overall Severity Severity of abnormal movements (highest score from questions above): None, normal Incapacitation due to abnormal movements: None, normal Patient's awareness of abnormal movements (rate only patient's report): No Awareness, Dental Status Current problems with teeth and/or dentures?: Yes Does patient usually wear dentures?: Yes  CIWA:  CIWA-Ar Total: 4 COWS:  Assessment/Plan:  Alcohol use disorder; tolerating detox will continue Depression: on cymbalta, improving Sleep: improved Continue current treatment plan.   Thresa RossAKHTAR, Sole Lengacher, MD 01/30/2016, 11:16 AM
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
## 2016-01-31 MED ORDER — HYDROXYZINE HCL 25 MG PO TABS
25.0000 mg | ORAL_TABLET | Freq: Four times a day (QID) | ORAL | Status: DC | PRN
  Administered 2016-01-31 – 2016-02-01 (×3): 25 mg via ORAL
  Filled 2016-01-31: qty 1
  Filled 2016-01-31: qty 10
  Filled 2016-01-31: qty 1

## 2016-01-31 NOTE — Plan of Care (Signed)
Problem: Safety: Goal: Ability to remain free from injury will improve Outcome: Progressing Patient has remained free from injury this shift.   

## 2016-01-31 NOTE — Progress Notes (Signed)
Patient did attend the evening speaker AA meeting.  

## 2016-01-31 NOTE — Progress Notes (Signed)
[facility]  01/31/2016 11:51 AM  Patient Active Problem List   Diagnosis Date Noted  . Alcohol use disorder, severe, dependence (HCC) 01/28/2016  . EtOH dependence (HCC) 01/28/2016  . Sepsis due to Escherichia coli (E. coli) (HCC) 01/08/2015  . Anxiety 01/08/2015  . AKI (acute kidney injury) (HCC) 01/08/2015  . Alcohol dependence with alcohol-induced mood disorder (HCC)   . Alcohol intoxication (HCC)   . Suicidal thoughts     Diagnosis: Alcohol use disorder, severe with withdrawal uncomplicated Subjective: alert, slept reasonable. Feels less stiff . Tolerating detox and shows cooperation with compliance  Reading book in room. Feels no significant withdrawals. Mood is improving Objective: well developed and nourishted. Feels to have better insight and depression improving. Tolerating detox.    Mood not depressed, somewhat anxious.  Affect congruent.  Cooperative, alert. No psychotic symptoms Insight and judjement is improving Speech clear and coherent. Insight improving Eye contact good No psychotic symptoms.  No mania Not in DT          Current Facility-Administered Medications (Analgesics):  .  acetaminophen (TYLENOL) tablet 650 mg .  meloxicam (MOBIC) tablet 15 mg     Current Facility-Administered Medications (Other):  .  alum & mag hydroxide-simeth (MAALOX/MYLANTA) 200-200-20 MG/5ML suspension 30 mL .  divalproex (DEPAKOTE) DR tablet 500 mg .  DULoxetine (CYMBALTA) DR capsule 60 mg .  hydrOXYzine (ATARAX/VISTARIL) tablet 25 mg .  loperamide (IMODIUM) capsule 2-4 mg .  LORazepam (ATIVAN) tablet 1 mg .  [COMPLETED] LORazepam (ATIVAN) tablet 1 mg **FOLLOWED BY** [COMPLETED] LORazepam (ATIVAN) tablet 1 mg **FOLLOWED BY** [COMPLETED] LORazepam (ATIVAN) tablet 1 mg **FOLLOWED BY** [START ON 02/01/2016] LORazepam (ATIVAN) tablet 1 mg .  magnesium hydroxide (MILK OF MAGNESIA) suspension 30 mL .  multivitamin with minerals tablet 1 tablet .  nicotine  (NICODERM CQ - dosed in mg/24 hours) patch 21 mg .  ondansetron (ZOFRAN-ODT) disintegrating tablet 4 mg .  thiamine (VITAMIN B-1) tablet 100 mg .  traZODone (DESYREL) tablet 50 mg  No current outpatient prescriptions on file.  Vital Signs:Blood pressure 138/77, pulse 89, temperature 97.7 F (36.5 C), temperature source Oral, resp. rate 16, height 5\' 8"  (1.727 m), weight 73.9 kg (163 lb), SpO2 99 %.    Lab Results:  No results found for this or any previous visit (from the past 48 hour(s)).  Physical Findings: AIMS: Facial and Oral Movements Muscles of Facial Expression: None, normal Lips and Perioral Area: None, normal Jaw: None, normal Tongue: None, normal,Extremity Movements Upper (arms, wrists, hands, fingers): None, normal Lower (legs, knees, ankles, toes): None, normal, Trunk Movements Neck, shoulders, hips: None, normal, Overall Severity Severity of abnormal movements (highest score from questions above): None, normal Incapacitation due to abnormal movements: None, normal Patient's awareness of abnormal movements (rate only patient's report): No Awareness, Dental Status Current problems with teeth and/or dentures?: Yes Does patient usually wear dentures?: Yes  CIWA:  CIWA-Ar Total: 1 COWS:      Assessment/Plan:  Alcohol use disorder; continue detox Depression: continue cymbalta Sleep: adequate.  Continue current treatment plan.  Consult Child psychotherapistsocial worker for rehab   Thresa RossAKHTAR, Leighanne Adolph, MD 01/31/2016, 11:51 AM

## 2016-01-31 NOTE — Progress Notes (Signed)
Nursing Progress Note: 7p-7a D: Pt currently presents with a anxious but pleasant affect and behavior. Pt reports to Clinical research associatewriter that their goal is to "keep working on staying clean." Pt states "I can't wait to go home and make this change in my life." Pt reports excellent sleep with current medication regimen.   A: Pt provided with medications per providers orders. Pt's labs and vitals were monitored throughout the night. Pt supported emotionally and encouraged to express concerns and questions. Pt educated on medications.  R: Pt's safety ensured with 15 minute and environmental checks. Pt currently denies SI/HI/Self Harm and A/V hallucinations. Pt verbally agrees to seek staff if SI/HI or A/VH occurs and to consult with staff before acting on any harmful thoughts. Will continue POC.

## 2016-01-31 NOTE — [facility] Group Notes (Signed)
[facility] Group Notes:  (Nursing/MHT/Case Management/Adjunct)  Date:  01/31/2016  Time:  4:55 PM  Type of Therapy:  Nurse Education  Participation Level:  Did Not Attend   Almira Barenny G Yaqub Arney 01/31/2016, 4:55 PM

## 2016-01-31 NOTE — [facility] Group Notes (Signed)
[facility] Group Notes: (Clinical Social Work)   01/31/2016      Type of Therapy:  Group Therapy   Participation Level:  Did Not Attend despite MHT prompting   Deryl Ports Grossman-Orr, LCSW 01/31/2016, 11:31 AM     

## 2016-01-31 NOTE — Progress Notes (Addendum)
Data. Patient denies SI/HI/AVH. Patient has been out of her room more this shift, interactive with peers and staff. She has still been spending most of shift in her room, reading and napping. Her affect rbrightens with interaction. On her self assessment she reports 0/10 for depression and hopelessness and 4/3510for anxiety. Her goal is: "Put together a short-term game plan".  Action. Emotional support and encouragement offered. Education provided on medication, indications and side effect. Q 15 minute checks done for safety. Response. Safety on the unit maintained through 15 minute checks.  Medications taken as prescribed. Did not attended groups.

## 2016-02-01 ENCOUNTER — Encounter (HOSPITAL_COMMUNITY): Payer: Self-pay | Admitting: General Practice

## 2016-02-01 MED ORDER — DIVALPROEX SODIUM 500 MG PO DR TAB: 500.0000 mg | DELAYED_RELEASE_TABLET | Freq: Two times a day (BID) | ORAL | 0 refills | Status: DC

## 2016-02-01 MED ORDER — NICOTINE 21 MG/24HR TD PT24: 21.0000 mg | MEDICATED_PATCH | Freq: Every day | TRANSDERMAL | 0 refills | Status: DC

## 2016-02-01 MED ORDER — DULOXETINE HCL 60 MG PO CPEP: 60.0000 mg | ORAL_CAPSULE | Freq: Two times a day (BID) | ORAL | 0 refills | Status: AC

## 2016-02-01 MED ORDER — HYDROXYZINE HCL 25 MG PO TABS: 25.0000 mg | ORAL_TABLET | Freq: Four times a day (QID) | ORAL | 0 refills | Status: DC | PRN

## 2016-02-01 MED ORDER — TRAZODONE HCL 50 MG PO TABS: 50.0000 mg | ORAL_TABLET | Freq: Every evening | ORAL | 0 refills | Status: DC | PRN

## 2016-02-01 NOTE — Progress Notes (Signed)
  [facility] :  Will you be returning to the same living situation after discharge:  Yes,  home At discharge, do you have transportation home?: Yes,  bf coming after 5pm to pick her up. Do you have the ability to pay for your medications: Yes,  mental health  Release of information consent forms completed and submitted to medical records by CSW.  Patient to Follow up at: Follow-up Information    [facility].   Why:  Social worker spoke with [facility]-they suggested that you walk in on Wednesday 02/03/16 at 12:30PM to be re-assessed for medication management; Substance abuse counseling services. Thank you.  Contact information: [address] [place], [place] [address] Phone: [phone] Fax: [phone]          Next level of care provider has access to [facility] Link:no  Safety Planning and Suicide Prevention discussed: Yes,  SPE completed with pt; pt declined to consent to family contact. SPI pamphlet and Mobile crisis information provided to pt.   Has patient been referred to the Quitline?: Patient refused referral  Patient has been referred for addiction treatment: Yes  Ariabella Brien N Smart LCSW 02/01/2016, 10:44 AM

## 2016-02-01 NOTE — [facility] Group Notes (Signed)
Pt attended spiritual care group on grief and loss facilitated by chaplain Burnis KingfisherMatthew Gissella Niblack   Group opened with brief discussion and psycho-social ed around grief and loss in relationships and in relation to self - identifying life patterns, circumstances, changes that cause losses. Established group norm of speaking from own life experience. Group goal of establishing open and affirming space for members to share loss and experience with grief, normalize grief experience and provide psycho social education and grief support.    Heather Hess arrived at group late.  As another group member was sharing, Heather Hess was tearful.  Facilitator opened space for her to relate what she was feeling.  She expressed grief around loss of father to death [place] same [place] where she lost a brother.  Heather Hess came to [place] to be with her daughter after the death of her husband,  Had 16 years sobriety and recently relapsed.  She was formerly a Systems developersymphony musician and played weddings and in moving to [place], lost a community with which to play music.  Heather Hess experienced support from other group members as she detailed parts of herself she wished to reclaim.  Also experienced support around relapse and dedication to sobriety.    [name], [name] Wayne MDiv

## 2016-02-01 NOTE — [facility] Suicide Risk Assessment (Signed)
[facility]   Principal Problem: Alcohol use disorder, severe, dependence (HCC) Discharge Diagnoses:  Patient Active Problem List   Diagnosis Date Noted  . Alcohol use disorder, severe, dependence (HCC) [F10.20] 01/28/2016  . EtOH dependence (HCC) [F10.20] 01/28/2016  . Sepsis due to Escherichia coli (E. coli) (HCC) [A41.51] 01/08/2015  . Anxiety [F41.9] 01/08/2015  . AKI (acute kidney injury) (HCC) [N17.9] 01/08/2015  . Alcohol dependence with alcohol-induced mood disorder (HCC) [F10.24]   . Alcohol intoxication (HCC) [F10.929]   . Suicidal thoughts [R45.851]     Total Time spent with patient: 20 minutes  Musculoskeletal: Strength & Muscle Tone: within normal limits Gait & Station: normal Patient leans: N/A  Psychiatric Specialty Exam: ROS  Blood pressure 116/66, pulse 81, temperature 97.9 F (36.6 C), temperature source Oral, resp. rate 16, height 5\' 8"  (1.727 m), weight 73.9 kg (163 lb), SpO2 99 %.Body mass index is 24.78 kg/m.  General Appearance: Casual  Eye Contact::  Good  Speech:  Clear and Coherent  Volume:  Normal  Mood:  Euthymic  Affect:  Appropriate  Thought Process:  Coherent  Orientation:  Full (Time, Place, and Person)  Thought Content:  Negative  Suicidal Thoughts:  No  Homicidal Thoughts:  No  Memory:  Negative  Judgement:  Fair  Insight:  Fair  Psychomotor Activity:  Normal  Concentration:  Good  Recall:  Good  Fund of Knowledge:Good  Language: Good  Akathisia:  No  Handed:  Right  AIMS (if indicated):     Assets:  Resilience  Sleep:  Number of Hours: 5.75  Cognition: WNL  ADL's:  Intact   Mental Status Per Nursing Assessment::   On Admission:     Demographic Factors:  Divorced or widowed and Caucasian  Loss Factors: Loss of significant relationship  Historical Factors: Family history of mental illness or substance abuse  Risk Reduction Factors:   Positive social support  Continued Clinical Symptoms:   Alcohol/Substance Abuse/Dependencies  Cognitive Features That Contribute To Risk:  None    Suicide Risk:  Mild:  Suicidal ideation of limited frequency, intensity, duration, and specificity.  There are no identifiable plans, no associated intent, mild dysphoria and related symptoms, good self-control (both objective and subjective assessment), few other risk factors, and identifiable protective factors, including available and accessible social support.  Follow-up Information    [facility].   Why:  Social worker spoke with [facility]-they suggested that you walk in on Wednesday 02/03/16 at 12:30PM to be re-assessed for medication management; Substance abuse counseling services. Thank you.  Contact information: [address] [place], [place] [address] Phone: [phone] Fax: [phone]          Plan Of Care/Follow-up recommendations:  Other:  Patient denies any current suicidal or homicidal ideation, plan or intent. She has successfully completed detoxification from alcohol. Patient is encouraged to keep her planned follow-up with a [facility] after discharge for treatment of substance and mental health issues.  Heather SitElizabeth Hess Heather Hewes, MD 02/01/2016, 1:52 PM

## 2016-02-01 NOTE — Discharge Summary (Signed)
Physician Discharge Summary Note  Patient:  Heather Hess Sabic is an 55 y.o., female MRN:  [id number] DOB:  [birth year] Patient phone:  [phone] (home)  Patient address:   [address] [place] [id number],  Total Time spent with patient: 30 minutes  Date of Admission:  01/28/2016 Date of Discharge: 02/01/2016  Reason for Admission:  Intoxication, worsening depression  Principal Problem: Alcohol use disorder, severe, dependence [facility](HCC) Discharge Diagnoses: Patient Active Problem List   Diagnosis Date Noted  . Alcohol use disorder, severe, dependence (HCC) [F10.20] 01/28/2016  . EtOH dependence (HCC) [F10.20] 01/28/2016  . Sepsis due to Escherichia coli (E. coli) (HCC) [A41.51] 01/08/2015  . Anxiety [F41.9] 01/08/2015  . AKI (acute kidney injury) (HCC) [N17.9] 01/08/2015  . Alcohol dependence with alcohol-induced mood disorder (HCC) [F10.24]   . Alcohol intoxication (HCC) [F10.929]   . Suicidal thoughts [R45.851]     Past Psychiatric History: see HPI  Past Medical History:  Past Medical History:  Diagnosis Date  . Acute kidney injury (HCC)   . Alcohol abuse   . Anxiety   . Chronic back pain   . Depression   . Herniated cervical disc   . Opiate abuse, episodic   . Septicemia [facility](HCC)     Past Surgical History:  Procedure Laterality Date  . ABDOMINAL HYSTERECTOMY    . CHOLECYSTECTOMY    . HEMORRHOID SURGERY    . TONSILLECTOMY     Family History: History reviewed. No pertinent family history. Family Psychiatric  History: see HPI Social History:  History  Alcohol Use  . 1.2 oz/week  . 2 Cans of beer per week    Comment: 2 - 24 oz beers (8%alc) per patient     History  Drug Use No    Social History   Social History  . Marital status: Widowed    Spouse name: N/A  . Number of children: N/A  . Years of education: N/A   Social History Main Topics  . Smoking status: Current Every Day Smoker    Packs/day: 0.25    Years: 10.00    Types:  E-cigarettes  . Smokeless tobacco: Never Used  . Alcohol use 1.2 oz/week    2 Cans of beer per week     Comment: 2 - 24 oz beers (8%alc) per patient  . Drug use: No  . Sexual activity: Yes    Birth control/ protection: Condom   Other Topics Concern  . None   Social History Narrative  . None    Hospital Course:  Heather Hess Osterlund is an 55 y.o. female.was admitted with worsening depression, intoxication and suicidal ideation.  Police were called to the patient's residence where she was found intoxicated care and I saying she was going to kill her self. She lost her son 15 years ago and is still depressed about this.  According to IVC papers patient was saying that she "wanted to cut her wrists and not wake up in the morning."   Heather Hess Joerger was admitted for Alcohol use disorder, severe, dependence (HCC) and crisis management.  Patient was treated with medications with their indications listed below in detail under Medication List.  Medical problems were identified and treated as needed.  Home medications were restarted as appropriate.  Improvement was monitored by observation and Heather Hess Derderian daily report of symptom reduction.  Emotional and mental status was monitored by daily self inventory reports completed by Heather Hess Baroni and clinical staff.  Patient reported continued improvement, denied any new  concerns.  Patient had been compliant on medications and denied side effects.  Support and encouragement was provided.    Heather Hess Cretella was evaluated by the treatment team for stability and plans for continued recovery upon discharge.  Patient was offered further treatment options upon discharge including Residential, Intensive Outpatient and Outpatient treatment. Patient will follow up with agency listed below for medication management and counseling.  Encouraged patient to maintain satisfactory support network and home environment.  Advised to adhere to medication compliance and  outpatient treatment follow up.  Prescriptions provided.       Heather Hess Matranga motivation was an integral factor for scheduling further treatment.  Employment, transportation, bed availability, health status, family support, and any pending legal issues were also considered during patient's hospital stay.  Upon completion of this admission the patient was both mentally and medically stable for discharge denying suicidal/homicidal ideation, auditory/visual/tactile hallucinations, delusional thoughts and paranoia.      Physical Findings: AIMS: Facial and Oral Movements Muscles of Facial Expression: None, normal Lips and Perioral Area: None, normal Jaw: None, normal Tongue: None, normal,Extremity Movements Upper (arms, wrists, hands, fingers): None, normal Lower (legs, knees, ankles, toes): None, normal, Trunk Movements Neck, shoulders, hips: None, normal, Overall Severity Severity of abnormal movements (highest score from questions above): None, normal Incapacitation due to abnormal movements: None, normal Patient's awareness of abnormal movements (rate only patient's report): No Awareness, Dental Status Current problems with teeth and/or dentures?: Yes Does patient usually wear dentures?: Yes  CIWA:  CIWA-Ar Total: 0 COWS:     Musculoskeletal: Strength & Muscle Tone: within normal limits Gait & Station: normal Patient leans: N/A  Psychiatric Specialty Exam: Physical Exam  Nursing note and vitals reviewed. Psychiatric: She has a normal mood and affect. Her speech is normal and behavior is normal. Judgment and thought content normal. Cognition and memory are normal.    ROS  Blood pressure 116/66, pulse 81, temperature 97.9 F (36.6 C), temperature source Oral, resp. rate 16, height 5\' 8"  (1.727 m), weight 73.9 kg (163 lb), SpO2 99 %.Body mass index is 24.78 kg/m.       Has this patient used any form of tobacco in the last 30 days? (Cigarettes, Smokeless Tobacco, Cigars, and/or  Pipes) Yes, Rx given to patient   Blood Alcohol level:  Lab Results  Component Value Date   ETH 176 (H) 01/28/2016   ETH 329 (HH) 01/27/2016    Metabolic Disorder Labs:  Lab Results  Component Value Date   HGBA1C 5.5 07/22/2014   MPG 111 07/22/2014   No results found for: PROLACTIN Lab Results  Component Value Date   CHOL 265 (H) 07/22/2014   TRIG 207 (H) 07/22/2014   HDL 74 07/22/2014   CHOLHDL 3.6 07/22/2014   VLDL 41 (H) 07/22/2014   LDLCALC 150 (H) 07/22/2014    See Psychiatric Specialty Exam and Suicide Risk Assessment completed by Attending Physician prior to discharge.  Discharge destination:  Home  Is patient on multiple antipsychotic therapies at discharge:  No   Has Patient had three or more failed trials of antipsychotic monotherapy by history:  No  Recommended Plan for Multiple Antipsychotic Therapies: NA     Medication List    STOP taking these medications   busPIRone 5 MG tablet Commonly known as:  BUSPAR   cephALEXin 500 MG capsule Commonly known as:  KEFLEX   diphenhydrAMINE 50 MG tablet Commonly known as:  BENADRYL   doxycycline 100 MG capsule Commonly known as:  VIBRAMYCIN  FLONASE 50 MCG/ACT nasal spray Generic drug:  fluticasone   naproxen sodium 220 MG tablet Commonly known as:  ANAPROX   oxyCODONE-acetaminophen 5-325 MG tablet Commonly known as:  PERCOCET/ROXICET   prenatal multivitamin Tabs tablet     TAKE these medications     Indication  divalproex 500 MG DR tablet Commonly known as:  DEPAKOTE Take 1 tablet (500 mg total) by mouth every 12 (twelve) hours. What changed:  medication strength  how much to take  when to take this  Indication:  mood stabilization   DULoxetine 60 MG capsule Commonly known as:  CYMBALTA Take 1 capsule (60 mg total) by mouth 2 (two) times daily.  Indication:  Major Depressive Disorder   hydrOXYzine 25 MG tablet Commonly known as:  ATARAX/VISTARIL Take 1 tablet (25 mg total) by  mouth every 6 (six) hours as needed for anxiety. What changed:  when to take this  reasons to take this  Indication:  Anxiety Neurosis   nicotine 21 mg/24hr patch Commonly known as:  NICODERM CQ - dosed in mg/24 hours Place 1 patch (21 mg total) onto the skin daily. Start taking on:  02/02/2016  Indication:  Nicotine Addiction   traZODone 50 MG tablet Commonly known as:  DESYREL Take 1 tablet (50 mg total) by mouth at bedtime as needed for sleep (may repeat times one if not effective in one hour). What changed:  when to take this  reasons to take this  Indication:  Trouble Sleeping      Follow-up Information    [facility] Follow [facility]   Why:  Social worker spoke with [facility]-they suggested that you walk in on Wednesday 02/03/16 at 12:30PM to be re-assessed for medication management; Substance abuse counseling services. Thank you.  Contact information: [address] [place], [place] [address] Phone: [phone] Fax: [phone]          Follow-up recommendations:  Activity:  as tol Diet:  as tol  Comments:  1.  Take all your medications as prescribed.   2.  Report any adverse side effects to outpatient provider. 3.  Patient instructed to not use alcohol or illegal drugs while on prescription medicines. 4.  In the event of worsening symptoms, instructed patient to call 911, the crisis hotline or go to nearest emergency room for evaluation of symptoms.  Signed: Lindwood Qua, NP [facility] 02/01/2016, 1:12 PM

## 2016-02-01 NOTE — Progress Notes (Signed)
Recreation Therapy Notes  Date: 02/01/16 Time: 0930 Location: 300 Hall Dayroom  Group Topic: Stress Management  Goal Area(s) Addresses:  Patient will verbalize importance of using healthy stress management.  Patient will identify positive emotions associated with healthy stress management.   Intervention: Calm App  Activity :  LRT introduced the stress management concept of meditation.  LRT played a meditation on anxiety from the Calm App.  Patients were to follow along with the app to engage in the meditation.  Education:  Stress Management, Discharge Planning.   Education Outcome: Acknowledges edcuation/In group clarification offered/Needs additional education  Clinical Observations/Feedback: Pt did not attend group.    Caitriona Sundquist, LRT/CTRS         Christifer Chapdelaine A 02/01/2016 11:50 AM 

## 2016-02-01 NOTE — Tx Team (Signed)
Interdisciplinary Treatment and Diagnostic Plan Update  02/01/2016 Time of Session: 9:30AM Heather Hess MRN: [id number]  Principal Diagnosis: Alcohol use disorder, severe, dependence ([place])  Secondary Diagnoses: Principal Problem:   Alcohol use disorder, severe, dependence ([place]) Active Problems:   EtOH dependence ([place])   Current Medications:  Current Facility-Administered Medications  Medication Dose Route Frequency Provider Last Rate Last Dose  . acetaminophen (TYLENOL) tablet 650 mg  650 mg Oral Q6H PRN Linard Millers, MD   650 mg at 01/31/16 1632  . alum & mag hydroxide-simeth (MAALOX/MYLANTA) 200-200-20 MG/5ML suspension 30 mL  30 mL Oral Q4H PRN Linard Millers, MD      . divalproex (DEPAKOTE) DR tablet 500 mg  500 mg Oral Q12H Linard Millers, MD   500 mg at 02/01/16 0847  . DULoxetine (CYMBALTA) DR capsule 60 mg  60 mg Oral BID Linard Millers, MD   60 mg at 02/01/16 0847  . hydrOXYzine (ATARAX/VISTARIL) tablet 25 mg  25 mg Oral Q6H PRN Derrill Center, NP   25 mg at 01/31/16 1827  . magnesium hydroxide (MILK OF MAGNESIA) suspension 30 mL  30 mL Oral Daily PRN Linard Millers, MD      . meloxicam [facility]) tablet 15 mg  15 mg Oral Q breakfast Linard Millers, MD   15 mg at 02/01/16 0847  . multivitamin with minerals tablet 1 tablet  1 tablet Oral Daily Linard Millers, MD   1 tablet at 02/01/16 0847  . nicotine (NICODERM CQ - dosed in mg/24 hours) patch 21 mg  21 mg Transdermal Daily Linard Millers, MD   21 mg at 02/01/16 0848  . thiamine (VITAMIN B-1) tablet 100 mg  100 mg Oral Daily Linard Millers, MD   100 mg at 02/01/16 0847  . traZODone (DESYREL) tablet 50 mg  50 mg Oral QHS PRN Linard Millers, MD   50 mg at 01/31/16 2241   PTA Medications: Prescriptions Prior to Admission  Medication Sig Dispense Refill Last Dose  . divalproex (DEPAKOTE) 250 MG DR tablet Take 250 mg by mouth 2 (two) times daily.   01/27/2016 at  Unknown time  . DULoxetine (CYMBALTA) 60 MG capsule Take 1 capsule (60 mg total) by mouth 2 (two) times daily. 60 capsule 0 01/27/2016 at Unknown time  . fluticasone (FLONASE) 50 MCG/ACT nasal spray Place 1-2 sprays into both nostrils daily as needed for allergies or rhinitis.   01/27/2016 at Unknown time  . hydrOXYzine (ATARAX/VISTARIL) 25 MG tablet Take 1 tablet (25 mg total) by mouth every 6 (six) hours as needed for anxiety. (Patient taking differently: Take 25 mg by mouth 2 (two) times daily. ) 30 tablet 0 01/27/2016 at Unknown time  . naproxen sodium (ANAPROX) 220 MG tablet Take 440 mg by mouth 2 (two) times daily with a meal.   01/27/2016 at Unknown time  . Prenatal Vit-Fe Fumarate-FA (PRENATAL MULTIVITAMIN) TABS tablet Take 1 tablet by mouth daily at 12 noon.   01/27/2016 at Unknown time  . traZODone (DESYREL) 50 MG tablet Take 1 tablet (50 mg total) by mouth at bedtime and may repeat dose one time if needed. (Patient taking differently: Take 50 mg by mouth at bedtime and may repeat dose one time if needed. For sleep) 30 tablet 0 Past Week at Unknown time  . busPIRone (BUSPAR) 5 MG tablet Take 1 tablet (5 mg total) by mouth 2 (two) times daily. (Patient not taking: Reported on 01/28/2016) 60 tablet 0  Not Taking at Unknown time  . cephALEXin (KEFLEX) 500 MG capsule Take 1 capsule (500 mg total) by mouth every 8 (eight) hours. (Patient not taking: Reported on 08/14/2015) 15 capsule 0 Not Taking at Unknown time  . diphenhydrAMINE (BENADRYL) 50 MG tablet Take 0.5 tablets (25 mg total) by mouth at bedtime as needed for itching. (Patient not taking: Reported on 08/14/2015) 30 tablet 0 Not Taking at Unknown time  . doxycycline (VIBRAMYCIN) 100 MG capsule Take 1 capsule (100 mg total) by mouth 2 (two) times daily. One po bid x 7 days (Patient not taking: Reported on 01/28/2016) 14 capsule 0 Not Taking at Unknown time  . nicotine (NICODERM CQ - DOSED IN MG/24 HOURS) 21 mg/24hr patch Place 1 patch (21 mg  total) onto the skin daily. (Patient not taking: Reported on 08/14/2015) 28 patch 0 Not Taking at Unknown time  . oxyCODONE-acetaminophen (PERCOCET/ROXICET) 5-325 MG tablet Take 2 tablets by mouth every 4 (four) hours as needed for moderate pain. (Patient not taking: Reported on 08/14/2015) 10 tablet 0 Not Taking at Unknown time    Patient Stressors: Loss of signifcant other Medication change or noncompliance Substance abuse  Patient Strengths: Ability for insight Active sense of humor Capable of independent living General fund of knowledge Motivation for treatment/growth  Treatment Modalities: Medication Management, Group therapy, Case management,  1 to 1 session with clinician, Psychoeducation, Recreational therapy.   Physician Treatment Plan for Primary Diagnosis: Alcohol use disorder, severe, dependence ([place]) Long Term Goal(s): Improvement in symptoms so as ready for discharge Improvement in symptoms so as ready for discharge   Short Term Goals: Ability to maintain clinical measurements within normal limits will improve Ability to identify changes in lifestyle to reduce recurrence of condition will improve Ability to identify triggers associated with substance abuse/mental health issues will improve  Medication Management: Evaluate patient's response, side effects, and tolerance of medication regimen.  Therapeutic Interventions: 1 to 1 sessions, Unit Group sessions and Medication administration.  Evaluation of Outcomes: met  Physician Treatment Plan for Secondary Diagnosis: Principal Problem:   Alcohol use disorder, severe, dependence ([place]) Active Problems:   EtOH dependence ([place])  Long Term Goal(s): Improvement in symptoms so as ready for discharge Improvement in symptoms so as ready for discharge   Short Term Goals: Ability to maintain clinical measurements within normal limits will improve Ability to identify changes in lifestyle to reduce recurrence of condition will  improve Ability to identify triggers associated with substance abuse/mental health issues will improve     Medication Management: Evaluate patient's response, side effects, and tolerance of medication regimen.  Therapeutic Interventions: 1 to 1 sessions, Unit Group sessions and Medication administration.  Evaluation of Outcomes: Met   RN Treatment Plan for Primary Diagnosis: Alcohol use disorder, severe, dependence ([place]) Long Term Goal(s): Knowledge of disease and therapeutic regimen to maintain health will improve  Short Term Goals: Ability to remain free from injury will improve, Ability to disclose and discuss suicidal ideas and Ability to identify and develop effective coping behaviors will improve  Medication Management: RN will administer medications as ordered by provider, will assess and evaluate patient's response and provide education to patient for prescribed medication. RN will report any adverse and/or side effects to prescribing provider.  Therapeutic Interventions: 1 on 1 counseling sessions, Psychoeducation, Medication administration, Evaluate responses to treatment, Monitor vital signs and CBGs as ordered, Perform/monitor CIWA, COWS, AIMS and Fall Risk screenings as ordered, Perform wound care treatments as ordered.  Evaluation of Outcomes: Met  LCSW Treatment Plan for Primary Diagnosis: Alcohol use disorder, severe, dependence ([place]) Long Term Goal(s): Safe transition to appropriate next level of care at discharge, Engage patient in therapeutic group addressing interpersonal concerns.  Short Term Goals: Engage patient in aftercare planning with referrals and resources, Facilitate patient progression through stages of change regarding substance use diagnoses and concerns and Identify triggers associated with mental health/substance abuse issues  Therapeutic Interventions: Assess for all discharge needs, 1 to 1 time with Social worker, Explore available resources and support  systems, Assess for adequacy in community support network, Educate family and significant other(s) on suicide prevention, Complete Psychosocial Assessment, Interpersonal group therapy.  Evaluation of Outcomes: Met   Progress in Treatment: Attending groups: Yes. Participating in groups: Yes. Taking medication as prescribed: Yes. Toleration medication: Yes. Family/Significant other contact made: SPE completed with pt; pt declined to consent to family contact. Patient understands diagnosis: Yes. Discussing patient identified problems/goals with staff: Yes. Medical problems stabilized or resolved: Yes. Denies suicidal/homicidal ideation: Yes. Issues/concerns per patient self-inventory: No. Other: n/a  New problem(s) identified: Yes, Describe:  pt reports poor sleep last night. MD made aware  New Short Term/Long Term Goal(s): medication stabilization; detox; development of comprehensive mental wellness/sobriety plan.   Discharge Plan or Barriers: Pt plans to return home with fiance; follow-up at current provider--[facility] in [place].   Reason for Continuation of Hospitalization: none  Estimated Length of Stay: d/c scheduled for today, after 5pm.   Attendees: Patient: 02/01/2016 10:26 AM  Physician: Dr. Sharolyn Douglas MD 02/01/2016 10:26 AM  Nursing: Franco Nones RN; Marilynne Halsted RN 02/01/2016 10:26 AM  RN Care Manager: Lars Pinks CM 02/01/2016 10:26 AM  Social Worker: Maxie Better, LCSW; Victoria Drinkard LCSW 02/01/2016 10:26 AM  Recreational Therapist:  02/01/2016 10:26 AM  Other: Ricky Ala NP; May Augustin NP 02/01/2016 10:26 AM  Other:  02/01/2016 10:26 AM  Other: 02/01/2016 10:26 AM    Scribe for Treatment Team: Kimber Relic Smart, LCSW 02/01/2016 10:26 AM

## 2016-02-01 NOTE — Progress Notes (Signed)
Patient ID: Heather MarbleLora Yohannes, female   DOB: 18-Mar-1960, 55 y.o.   MRN: [id number]  DAR: Pt. Denies SI/HI and A/V Hallucinations. She reports sleep is poor, appetite is good, energy level is low, and concentration is good. She rates depression 2/10, hopelessness 2/10, and anxiety 6/10. Vistaril was administered and provided some relief. Patient reports some pain and received PRN Tylenol which was effective. Support and encouragement provided to the patient. Scheduled medications administered to patient per physician's orders. Patient is minimal but cooperative with Clinical research associatewriter. She reports some agitation and irritability this morning but this has resolved by afternoon. Q15 minute checks are maintained for safety.

## 2016-02-01 NOTE — Progress Notes (Signed)
Patient ID: [name], female   DOB: 1961/01/14, 55 y.o.   MRN: [id number]  Discharge Note: Pt. Denies SI/HI and A/V hallucinations. Belongings returned to patient at time of discharge. Patient denies any pain or discomfort. Discharge instructions and medications were reviewed with patient. Patient verbalized understanding of both medications and discharge instructions. Patient discharged to lobby where her boyfriend was there to pick her up. Q15 minute safety checks maintained until time of discharge. No distress upon discharge.

## 2016-02-16 ENCOUNTER — Encounter (HOSPITAL_COMMUNITY): Payer: Self-pay | Admitting: Emergency Medicine

## 2016-02-16 ENCOUNTER — Emergency Department (HOSPITAL_COMMUNITY): Payer: Self-pay

## 2016-02-16 ENCOUNTER — Emergency Department (HOSPITAL_COMMUNITY)
Admission: EM | Admit: 2016-02-16 | Discharge: 2016-02-16 | Disposition: A | Discharge status: Home / Self Care | Payer: Self-pay | Attending: Emergency Medicine | Admitting: Emergency Medicine

## 2016-02-16 DIAGNOSIS — F10920 Alcohol use, unspecified with intoxication, uncomplicated: Secondary | ICD-10-CM

## 2016-02-16 DIAGNOSIS — R51 Headache: Secondary | ICD-10-CM | POA: Insufficient documentation

## 2016-02-16 DIAGNOSIS — R519 Headache, unspecified: Secondary | ICD-10-CM

## 2016-02-16 DIAGNOSIS — F1010 Alcohol abuse, uncomplicated: Secondary | ICD-10-CM

## 2016-02-16 DIAGNOSIS — R791 Abnormal coagulation profile: Secondary | ICD-10-CM | POA: Insufficient documentation

## 2016-02-16 DIAGNOSIS — R479 Unspecified speech disturbances: Secondary | ICD-10-CM | POA: Insufficient documentation

## 2016-02-16 DIAGNOSIS — Z79899 Other long term (current) drug therapy: Secondary | ICD-10-CM | POA: Insufficient documentation

## 2016-02-16 DIAGNOSIS — F17210 Nicotine dependence, cigarettes, uncomplicated: Secondary | ICD-10-CM | POA: Insufficient documentation

## 2016-02-16 LAB — DIFFERENTIAL
BASOS ABS: 0 10*3/uL (ref 0.0–0.1)
BASOS PCT: 1 %
Eosinophils Absolute: 0 10*3/uL (ref 0.0–0.7)
Eosinophils Relative: 1 %
LYMPHS ABS: 2.9 10*3/uL (ref 0.7–4.0)
Lymphocytes Relative: 58 %
MONOS PCT: 4 %
Monocytes Absolute: 0.2 10*3/uL (ref 0.1–1.0)
NEUTROS ABS: 1.8 10*3/uL (ref 1.7–7.7)
Neutrophils Relative %: 36 %

## 2016-02-16 LAB — URINALYSIS, ROUTINE W REFLEX MICROSCOPIC
Bilirubin Urine: NEGATIVE
GLUCOSE, UA: NEGATIVE mg/dL
Hgb urine dipstick: NEGATIVE
Ketones, ur: NEGATIVE mg/dL
LEUKOCYTES UA: NEGATIVE
Nitrite: NEGATIVE
Protein, ur: NEGATIVE mg/dL
Specific Gravity, Urine: 1.002 — ABNORMAL LOW (ref 1.005–1.030)
pH: 6 (ref 5.0–8.0)

## 2016-02-16 LAB — COMPREHENSIVE METABOLIC PANEL
ALT: 18 U/L (ref 14–54)
AST: 27 U/L (ref 15–41)
Albumin: 4.2 g/dL (ref 3.5–5.0)
Alkaline Phosphatase: 62 U/L (ref 38–126)
Anion gap: 13 (ref 5–15)
BILIRUBIN TOTAL: 0.4 mg/dL (ref 0.3–1.2)
BUN: 7 mg/dL (ref 6–20)
CHLORIDE: 105 mmol/L (ref 101–111)
CO2: 21 mmol/L — ABNORMAL LOW (ref 22–32)
CREATININE: 0.76 mg/dL (ref 0.44–1.00)
Calcium: 8.9 mg/dL (ref 8.9–10.3)
GFR calc Af Amer: >60 mL/min (ref >60)
GLUCOSE: 96 mg/dL (ref 65–99)
Potassium: 4.3 mmol/L (ref 3.5–5.1)
Sodium: 139 mmol/L (ref 135–145)
Total Protein: 7 g/dL (ref 6.5–8.1)

## 2016-02-16 LAB — CBC
HEMATOCRIT: 42.9 % (ref 36.0–46.0)
HEMOGLOBIN: 14.6 g/dL (ref 12.0–15.0)
MCH: 31.1 pg (ref 26.0–34.0)
MCHC: 34 g/dL (ref 30.0–36.0)
MCV: 91.3 fL (ref 78.0–100.0)
Platelets: 247 10*3/uL (ref 150–400)
RBC: 4.7 MIL/uL (ref 3.87–5.11)
RDW: 12.5 % (ref 11.5–15.5)
WBC: 4.9 10*3/uL (ref 4.0–10.5)

## 2016-02-16 LAB — CBG MONITORING, ED: Glucose-Capillary: 96 mg/dL (ref 65–99)

## 2016-02-16 LAB — RAPID URINE DRUG SCREEN, HOSP PERFORMED
Amphetamines: NOT DETECTED
BARBITURATES: NOT DETECTED
Benzodiazepines: NOT DETECTED
Cocaine: NOT DETECTED
Opiates: NOT DETECTED
Tetrahydrocannabinol: NOT DETECTED

## 2016-02-16 LAB — I-STAT CHEM 8, ED
BUN: 8 mg/dL (ref 6–20)
CHLORIDE: 104 mmol/L (ref 101–111)
CREATININE: 0.9 mg/dL (ref 0.44–1.00)
Calcium, Ion: 1.04 mmol/L — ABNORMAL LOW (ref 1.15–1.40)
GLUCOSE: 94 mg/dL (ref 65–99)
HCT: 42 % (ref 36.0–46.0)
Hemoglobin: 14.3 g/dL (ref 12.0–15.0)
POTASSIUM: 4.2 mmol/L (ref 3.5–5.1)
Sodium: 139 mmol/L (ref 135–145)
TCO2: 22 mmol/L (ref 0–100)

## 2016-02-16 LAB — ETHANOL: Alcohol, Ethyl (B): 155 mg/dL — ABNORMAL HIGH (ref <5)

## 2016-02-16 LAB — PROTIME-INR
INR: 1.01
Prothrombin Time: 13.3 seconds (ref 11.4–15.2)

## 2016-02-16 LAB — I-STAT TROPONIN, ED: TROPONIN I, POC: 0 ng/mL (ref 0.00–0.08)

## 2016-02-16 LAB — APTT: APTT: 31 s (ref 24–36)

## 2016-02-16 MED ORDER — SODIUM CHLORIDE 0.9 % IV BOLUS (SEPSIS)
1000.0000 mL | Freq: Once | INTRAVENOUS | Status: AC
  Administered 2016-02-16: 1000 mL via INTRAVENOUS

## 2016-02-16 MED ORDER — METOCLOPRAMIDE HCL 5 MG/ML IJ SOLN
10.0000 mg | Freq: Once | INTRAMUSCULAR | Status: AC
  Administered 2016-02-16: 10 mg via INTRAVENOUS
  Filled 2016-02-16: qty 2

## 2016-02-16 MED ORDER — MAGNESIUM SULFATE 2 GM/50ML IV SOLN
2.0000 g | Freq: Once | INTRAVENOUS | Status: AC
  Administered 2016-02-16: 2 g via INTRAVENOUS
  Filled 2016-02-16: qty 50

## 2016-02-16 NOTE — ED Notes (Signed)
Daughter called to get status of her mom, PT was unable to talk on the phone.  [place] ( daughter) [phone]

## 2016-02-16 NOTE — Progress Notes (Signed)
Code Stroke called on 55 y.o female. Pertinent history includes ETOH abuse, anxiety, suicidal ideation, [facility] abuse/suicide/depression.  LSN 1600. Pt complains of acute head ache blurred vision speech changes at home. EMS was called and noted no neurological deficits on arrival. In route to the hospital the patient was having some difficulty with word finding and word choices but was not slurring her speech and maintained that her vision continued to be blurred throughout. CBG 96.Patient admitted to drinking beer earlier today. Pt taken for stat head CT. Reviewed per Neurologist Dr Roseanne RenoStewart as negative for acute abnormalities.  NIHSS completed score 0 no deficits. Code stroke canceled per Dr. Roseanne RenoStewart.

## 2016-02-16 NOTE — ED Provider Notes (Signed)
The patient is a 55 year old female, she does have some psychiatric history and was recently under involuntary commitment for suicidality. She was brought in by paramedic transport after developing acute onset of difficulty speaking with vision and a headache at 4:00 PM. She complained that she was having blurry vision. This then resolved by the time the paramedics arrived over she was still having a headache. In route to the hospital the patient was having some difficulty with word finding and word choices but was not slurring her speech and maintained that her vision continued to be blurred throughout. On arrival the patient is having some difficulty with her speech, she has some difficulty expressing answers to the questions but is able to follow all of my commands with normal strength, cranial nerves III through XII appear to be intact. Visual acuity not tested on initial arrival as she was initiated as a code stroke and sent to the CAT scanner.  Seen by neurology - thought to be not neuro emergency but rather a headache realted - and in fact after getting medicines in the department she had no residual symptoms. I agree this is unlikely stroke and pt does not need further evaluation / imaging or admission.   EKG Interpretation  Date/Time:  Tuesday February 16 2016 19:38:31 EST Ventricular Rate:  93 PR Interval:    QRS Duration: 102 QT Interval:  395 QTC Calculation: 492 R Axis:   85 Text Interpretation:  Sinus rhythm Consider right atrial enlargement Borderline prolonged QT interval since last tracing no significant change Confirmed by Leandro Berkowitz  MD, Beyonca Wisz ([id number]) on 02/17/2016 10:17:25 AM      Consider complicated migraine, stroke, intracranial hemorrhage, toxicologic has definite possibilities.  I saw and evaluated the patient, reviewed the resident's note and I agree with the findings and plan.   Final diagnoses:  New onset headache  Speech disturbance, unspecified type      Eber HongBrian  Daysean Tinkham, MD 02/17/16 1017

## 2016-02-16 NOTE — ED Triage Notes (Signed)
Per EMS, pt began to have a headache around 4pm, when EMS arrived pt had a negative stroke screen.  WHile in route she began to experience difficulties speaking and expressing herself.  Code stroke activated.  Upon arrival pt's symptoms had resolved.  Pt reports drinking one beer today around 2pm.

## 2016-02-16 NOTE — Consult Note (Signed)
Admission H&P    Chief Complaint: [name] in speech difficulty.  HPI: Heather Hess is an 55 y.o. female with a history of abuse as well as opiate abuse, depression, anxiety and septicemia, brought to the ED and code stroke status following onset of headache and difficulty with speech.. She appeared to have problems with expressing herself clearly. No focal weakness was noted. No facial droop was noted. Onset of her symptoms was around 4 PM today. She has no previous history of stroke nor TIA. She has not been on antiplatelet therapy. CT scan of her head was unremarkable with no acute intracranial abnormality. NIH stroke score in ED was 0. Patient admitted to drinking beer earlier today. Alcohol level was 155. Code stroke was canceled.  Past Medical History:  Diagnosis Date  . Acute kidney injury ([place])   . Alcohol abuse   . Anxiety   . Chronic back pain   . Depression   . Herniated cervical disc   . Opiate abuse, episodic   . Septicemia [facility])     Past Surgical History:  Procedure Laterality Date  . ABDOMINAL HYSTERECTOMY    . CHOLECYSTECTOMY    . HEMORRHOID SURGERY    . TONSILLECTOMY      No family history on file. Social History:  reports that she has been smoking E-cigarettes.  She has a 2.50 pack-year smoking history. She has never used smokeless tobacco. She reports that she drinks about 1.2 oz of alcohol per week . She reports that she does not use drugs.  Allergies:  Allergies  Allergen Reactions  . Nsaids Hives    Patient takes Mobic at home    Medications: Preadmission medications were reviewed by me.  ROS: Unreliable due to altered mental status and confusion with mild agitation.  Physical Examination: Blood pressure 102/66, pulse 96, temperature 98.9 F (37.2 C), temperature source Oral, resp. rate 13, height _0  (1.727 m), weight 72.6 kg (160 lb), SpO2 95 %.  HEENT-  Normocephalic, no lesions, without obvious abnormality.  Normal external eye and  conjunctiva.  Normal TM's bilaterally.  Normal auditory canals and external ears. Normal external nose, mucus membranes and septum.  Normal pharynx. Neck supple with no masses, nodes, nodules or enlargement. Cardiovascular - regular rate and rhythm, S1, S2 normal, no murmur, click, rub or gallop Lungs - chest clear, no wheezing, rales, normal symmetric air entry Abdomen - soft, non-tender; bowel sounds normal; no masses,  no organomegaly Extremities - no joint deformities, effusion, or inflammation  Neurologic Examination: Mental Status: Alert, oriented, complaining of headache.  Speech fluent without evidence of aphasia. Able to follow commands without difficulty. Cranial Nerves: II-Visual fields were normal. III/IV/VI-Pupils were equal and reacted normally to light. Extraocular movements were full and conjugate.  V/VII-no facial numbness and no facial weakness. VIII-normal. X-normal speech and symmetrical palatal movement. XI: trapezius strength/neck flexion strength normal bilaterally XII-midline tongue extension with normal strength. Motor: 5/5 bilaterally with normal tone and bulk Sensory: Normal throughout. Deep Tendon Reflexes: 1+ and symmetric. Plantars: Flexor bilaterally Cerebellar: Normal finger-to-nose testing. Carotid auscultation: Normal  Results for orders placed or performed during the hospital encounter of 02/16/16 (from the past 48 hour(s))  CBG monitoring, ED     Status: None   Collection Time: 02/16/16  7:19 PM  Result Value Ref Range   Glucose-Capillary 96 65 - 99 mg/dL  Ethanol     Status: Abnormal   Collection Time: 02/16/16  7:22 PM  Result Value Ref Range   Alcohol,  Ethyl (B) 155 (H) <5 mg/dL    Comment:        LOWEST DETECTABLE LIMIT FOR SERUM ALCOHOL IS 5 mg/dL FOR MEDICAL PURPOSES ONLY   Protime-INR     Status: None   Collection Time: 02/16/16  7:22 PM  Result Value Ref Range   Prothrombin Time 13.3 11.4 - 15.2 seconds   INR 1.01   APTT      Status: None   Collection Time: 02/16/16  7:22 PM  Result Value Ref Range   aPTT 31 24 - 36 seconds  CBC     Status: None   Collection Time: 02/16/16  7:22 PM  Result Value Ref Range   WBC 4.9 4.0 - 10.5 K/uL   RBC 4.70 3.87 - 5.11 MIL/uL   Hemoglobin 14.6 12.0 - 15.0 g/dL   HCT 42.9 36.0 - 46.0 %   MCV 91.3 78.0 - 100.0 fL   MCH 31.1 26.0 - 34.0 pg   MCHC 34.0 30.0 - 36.0 g/dL   RDW 12.5 11.5 - 15.5 %   Platelets 247 150 - 400 K/uL  Differential     Status: None   Collection Time: 02/16/16  7:22 PM  Result Value Ref Range   Neutrophils Relative % 36 %   Neutro Abs 1.8 1.7 - 7.7 K/uL   Lymphocytes Relative 58 %   Lymphs Abs 2.9 0.7 - 4.0 K/uL   Monocytes Relative 4 %   Monocytes Absolute 0.2 0.1 - 1.0 K/uL   Eosinophils Relative 1 %   Eosinophils Absolute 0.0 0.0 - 0.7 K/uL   Basophils Relative 1 %   Basophils Absolute 0.0 0.0 - 0.1 K/uL  Comprehensive metabolic panel     Status: Abnormal   Collection Time: 02/16/16  7:22 PM  Result Value Ref Range   Sodium 139 135 - 145 mmol/L   Potassium 4.3 3.5 - 5.1 mmol/L   Chloride 105 101 - 111 mmol/L   CO2 21 (L) 22 - 32 mmol/L   Glucose, Bld 96 65 - 99 mg/dL   BUN 7 6 - 20 mg/dL   Creatinine, Ser 0.76 0.44 - 1.00 mg/dL   Calcium 8.9 8.9 - 10.3 mg/dL   Total Protein 7.0 6.5 - 8.1 g/dL   Albumin 4.2 3.5 - 5.0 g/dL   AST 27 15 - 41 U/L   ALT 18 14 - 54 U/L   Alkaline Phosphatase 62 38 - 126 U/L   Total Bilirubin 0.4 0.3 - 1.2 mg/dL   GFR calc non Af Amer >60 >60 mL/min   GFR calc Af Amer >60 >60 mL/min    Comment: (NOTE) The eGFR has been calculated using the CKD EPI equation. This calculation has not been validated in all clinical situations. eGFR's persistently <60 mL/min signify possible Chronic Kidney Disease.    Anion gap 13 5 - 15  I-Stat Chem 8, ED  (not at [facility], [facility])     Status: Abnormal   Collection Time: 02/16/16  7:26 PM  Result Value Ref Range   Sodium 139 135 - 145 mmol/L   Potassium 4.2 3.5 - 5.1 mmol/L    Chloride 104 101 - 111 mmol/L   BUN 8 6 - 20 mg/dL   Creatinine, Ser 0.90 0.44 - 1.00 mg/dL   Glucose, Bld 94 65 - 99 mg/dL   Calcium, Ion 1.04 (L) 1.15 - 1.40 mmol/L   TCO2 22 0 - 100 mmol/L   Hemoglobin 14.3 12.0 - 15.0 g/dL   HCT 42.0 36.0 -  46.0 %  I-stat troponin, ED (not at [facility], [facility])     Status: None   Collection Time: 02/16/16  8:01 PM  Result Value Ref Range   Troponin i, poc 0.00 0.00 - 0.08 ng/mL   Comment 3            Comment: Due to the release kinetics of cTnI, a negative result within the first hours of the onset of symptoms does not rule out myocardial infarction with certainty. If myocardial infarction is still suspected, repeat the test at appropriate intervals.    Ct Head Code Stroke W/o Cm  Result Date: 02/16/2016 CLINICAL DATA:  Code stroke.  Right-sided facial droop EXAM: CT HEAD WITHOUT CONTRAST TECHNIQUE: Contiguous axial images were obtained from the base of the skull through the vertex without intravenous contrast. COMPARISON:  None. FINDINGS: Brain: No mass lesion, intraparenchymal hemorrhage or extra-axial collection. No evidence of acute cortical infarct. Brain parenchyma and CSF-containing spaces are normal for age. Vascular: No hyperdense vessel or unexpected calcification. Skull: Normal visualized skull base, calvarium and extracranial soft tissues. Sinuses/Orbits: No sinus fluid levels or advanced mucosal thickening. No mastoid effusion. Normal orbits. ASPECTS [facility] Stroke Program Early CT Score) - Ganglionic level infarction (caudate, lentiform nuclei, internal capsule, insula, M1-M3 cortex): 7 - Supraganglionic infarction (M4-M6 cortex): 3 Total score (0-10 with 10 being normal): 10 IMPRESSION: 1. No acute intracranial abnormality. 2. ASPECTS is 10. I paged the stroke neurologist at 7:33 pm and again at 7:53 p.m. These results were called by telephone at the time of interpretation on 02/16/2016 at 7:55 pm to Dr. Wallie Char, who verbally  acknowledged these results. Electronically Signed   By: Ulyses Jarred M.D.   On: 02/16/2016 19:56    Assessment/Plan 55 year old lady with a history of abuse and depression, including admissions recently for the same, presenting with acute alcohol intoxication. Patient has no focal findings indicative of acute stroke. Etiology for her headache is unclear. CT scan of her head showed no acute intracranial abnormality.  Recommendations: 1. Management of alcohol withdrawal per the admitting team 2. Symptomatic management of headache with analgesic medication as needed 3. No further neuroimaging study is indicated 4. Alcohol rehabilitation if patient is amenable  C.R. Nicole Kindred, [facility]ist [phone]  02/16/2016, 8:17 PM

## 2016-02-16 NOTE — ED Provider Notes (Signed)
[facility]-EMERGENCY DEPT Provider Note   CSN: [id number] Arrival date & time: 02/16/16  4098     History   Chief Complaint Chief Complaint  Patient presents with  . Code Stroke    HPI Heather Hess is a 55 y.o. female.  The history is provided by the patient. No language interpreter was used.   This is a 55 year old female with a past medical history of alcohol abuse, anxiety, depression, opiate abuse who presents today with new onset headache that started around 3 PM today. She describes his headache is "deep in my brain". She endorses the pain as being sharp. States she had a similar headache several years ago. She's had nausea associated with this. Denies any fevers, chills, neck stiffness, neck pain. She does endorse drinking alcohol today. She denies any numbness or tingling or weakness in any of her extremity. Denies any chest pain, cough, congestion, abdominal pain, dysuria, hematuria, diarrhea. No worsening alleviating factors. Patient does arrive as a code stroke as EMS reports that her speech appeared to be this ordered when they were speaking with her.   Past Medical History:  Diagnosis Date  . Acute kidney injury ([facility])   . Alcohol abuse   . Anxiety   . Chronic back pain   . Depression   . Herniated cervical disc   . Opiate abuse, episodic   . Septicemia [facility])     Patient Active Problem List   Diagnosis Date Noted  . Alcohol use disorder, severe, dependence ([facility]) 01/28/2016  . EtOH dependence ([facility]) 01/28/2016  . Sepsis due to Escherichia coli (E. coli) ([facility]) 01/08/2015  . Anxiety 01/08/2015  . AKI (acute kidney injury) ([facility]) 01/08/2015  . Alcohol dependence with alcohol-induced mood disorder ([facility])   . Alcohol intoxication ([facility])   . Suicidal thoughts     Past Surgical History:  Procedure Laterality Date  . ABDOMINAL HYSTERECTOMY    . CHOLECYSTECTOMY    . HEMORRHOID SURGERY    . TONSILLECTOMY      OB History    No data available       Home  Medications    Prior to Admission medications   Medication Sig Start Date End Date Taking? Authorizing Provider  divalproex (DEPAKOTE) 500 MG DR tablet Take 1 tablet (500 mg total) by mouth every 12 (twelve) hours. 02/01/16   Adonis Brook, NP  DULoxetine (CYMBALTA) 60 MG capsule Take 1 capsule (60 mg total) by mouth 2 (two) times daily. 02/01/16   Adonis Brook, NP  hydrOXYzine (ATARAX/VISTARIL) 25 MG tablet Take 1 tablet (25 mg total) by mouth every 6 (six) hours as needed for anxiety. 02/01/16   Adonis Brook, NP  nicotine (NICODERM CQ - DOSED IN MG/24 HOURS) 21 mg/24hr patch Place 1 patch (21 mg total) onto the skin daily. 02/02/16   Adonis Brook, NP  traZODone (DESYREL) 50 MG tablet Take 1 tablet (50 mg total) by mouth at bedtime as needed for sleep (may repeat times one if not effective in one hour). 02/01/16   Adonis Brook, NP    Family History No family history on file.  Social History Social History  Substance Use Topics  . Smoking status: Current Every Day Smoker    Packs/day: 0.25    Years: 10.00    Types: E-cigarettes  . Smokeless tobacco: Never Used  . Alcohol use 1.2 oz/week    2 Cans of beer per week     Comment: 2 - 24 oz beers (8%alc) per patient  Allergies   Nsaids   Review of Systems Review of Systems  Constitutional: Negative for chills and fever.  HENT: Negative for ear pain and sore throat.   Eyes: Negative for pain and visual disturbance.  Respiratory: Negative for cough and shortness of breath.   Cardiovascular: Negative for chest pain and palpitations.  Gastrointestinal: Positive for nausea. Negative for abdominal pain and vomiting.  Genitourinary: Negative for dysuria and hematuria.  Musculoskeletal: Negative for arthralgias, back pain, neck pain and neck stiffness.  Skin: Negative for color change and rash.  Neurological: Positive for speech difficulty (as described in HPI) and headaches (as described). Negative for seizures, syncope,  weakness and numbness.  Psychiatric/Behavioral: Negative for agitation and confusion.  All other systems reviewed and are negative.    Physical Exam Updated Vital Signs BP 98/59   Pulse 82   Temp 98.9 F (37.2 C) (Oral)   Resp 18   Ht 5\' 8"  (1.727 m)   Wt 72.6 kg   SpO2 94%   BMI 24.33 kg/m   Physical Exam  Constitutional: She is oriented to person, place, and time. She appears well-developed and well-nourished. No distress.  HENT:  Head: Normocephalic and atraumatic.  Right Ear: External ear normal.  Left Ear: External ear normal.  Eyes: Conjunctivae and EOM are normal. Pupils are equal, round, and reactive to light.  Neck: Normal range of motion. Neck supple.  No nuchal rigidity  Cardiovascular: Normal rate and regular rhythm.   No murmur heard. Pulmonary/Chest: Effort normal and breath sounds normal. No respiratory distress.  Abdominal: Soft. There is no tenderness.  Musculoskeletal: She exhibits no edema.  Neurological: She is alert and oriented to person, place, and time. She has normal strength. She displays normal reflexes. No cranial nerve deficit (CN II-XII intact) or sensory deficit. Coordination normal. GCS eye subscore is 4. GCS verbal subscore is 5. GCS motor subscore is 6.  Skin: Skin is warm and dry. She is not diaphoretic.  Psychiatric: She has a normal mood and affect.  Nursing note and vitals reviewed.    ED Treatments / Results  Labs (all labs ordered are listed, but only abnormal results are displayed) Labs Reviewed  ETHANOL - Abnormal; Notable for the following:       Result Value   Alcohol, Ethyl (B) 155 (*)    All other components within normal limits  COMPREHENSIVE METABOLIC PANEL - Abnormal; Notable for the following:    CO2 21 (*)    All other components within normal limits  URINALYSIS, ROUTINE W REFLEX MICROSCOPIC - Abnormal; Notable for the following:    Color, Urine STRAW (*)    Specific Gravity, Urine 1.002 (*)    All other  components within normal limits  I-STAT CHEM 8, ED - Abnormal; Notable for the following:    Calcium, Ion 1.04 (*)    All other components within normal limits  PROTIME-INR  APTT  CBC  DIFFERENTIAL  RAPID URINE DRUG SCREEN, HOSP PERFORMED  ETHANOL  PROTIME-INR  CBC  DIFFERENTIAL  COMPREHENSIVE METABOLIC PANEL  CBG MONITORING, ED  I-STAT TROPOININ, ED  I-STAT CHEM 8, ED  I-STAT TROPOININ, ED    EKG  EKG Interpretation None       Radiology Ct Head Code Stroke W/o Cm  Result Date: 02/16/2016 CLINICAL DATA:  Code stroke.  Right-sided facial droop EXAM: CT HEAD WITHOUT CONTRAST TECHNIQUE: Contiguous axial images were obtained from the base of the skull through the vertex without intravenous contrast. COMPARISON:  None. FINDINGS:  Brain: No mass lesion, intraparenchymal hemorrhage or extra-axial collection. No evidence of acute cortical infarct. Brain parenchyma and CSF-containing spaces are normal for age. Vascular: No hyperdense vessel or unexpected calcification. Skull: Normal visualized skull base, calvarium and extracranial soft tissues. Sinuses/Orbits: No sinus fluid levels or advanced mucosal thickening. No mastoid effusion. Normal orbits. ASPECTS [facility]([place] Stroke Program Early CT Score) - Ganglionic level infarction (caudate, lentiform nuclei, internal capsule, insula, M1-M3 cortex): 7 - Supraganglionic infarction (M4-M6 cortex): 3 Total score (0-10 with 10 being normal): 10 IMPRESSION: 1. No acute intracranial abnormality. 2. ASPECTS is 10. I paged the stroke neurologist at 7:33 pm and again at 7:53 p.m. These results were called by telephone at the time of interpretation on 02/16/2016 at 7:55 pm to Dr. Noel Christmasharles Stewart, who verbally acknowledged these results. Electronically Signed   By: Deatra RobinsonKevin  Herman M.D.   On: 02/16/2016 19:56    Procedures Procedures (including critical care time)  Medications Ordered in ED Medications  metoCLOPramide (REGLAN) injection 10 mg (10 mg  Intravenous Given 02/16/16 2137)  sodium chloride 0.9 % bolus 1,000 mL (0 mLs Intravenous Stopped 02/16/16 2310)  magnesium sulfate IVPB 2 g 50 mL (0 g Intravenous Stopped 02/16/16 2310)     Initial Impression / Assessment and Plan / ED Course  I have reviewed the triage vital signs and the nursing notes.  Pertinent labs & imaging results that were available during my care of the patient were reviewed by me and considered in my medical decision making (see chart for details).  Clinical Course     Patient presents today with headache and speech abnormality noted at home. Patient arrived as a code stroke. She had no further findings on her exam. She states that this headache started within the last 6 hours. She had a head CT that was performed shortly after arrival that demonstrated no acute intracranial abnormalities. Given that this is started within the last 6-12 hours if her that there is low likelihood for subarachnoid hemorrhage. Her symptoms of speech abnormality resolved prior to getting here. She does have an elevated alcohol level here and this may be contributing to the patient's speech problem. She does have a known history of alcohol abuse. She denies any head injuries. She is not on any anticoagulation. Neurology has seen her and recommends treatment of headache and no further emergent imaging.  Patient was given Reglan IV in addition to magnesium and IV fluids. On my reevaluation the patient states that her headache had significantly improved. She is able to ambulate unassisted without difficulty. She has no abnormal findings on my exam and her vital signs are reassuring. We will discharge the patient home with neurology phone number to call and set her appointment for her headache symptoms. Patient is agreeable with this and discharged home in good condition.  Final Clinical Impressions(s) / ED Diagnoses   Final diagnoses:  New onset headache  Speech disturbance, unspecified type      New Prescriptions New Prescriptions   No medications on file     Madolyn FriezeVijay Lupita Rosales, MD 02/16/16 2325    Eber HongBrian Miller, MD 02/17/16 1017

## 2017-01-31 ENCOUNTER — Other Ambulatory Visit: Payer: Self-pay | Admitting: Nurse Practitioner

## 2017-01-31 DIAGNOSIS — Z1231 Encounter for screening mammogram for malignant neoplasm of breast: Secondary | ICD-10-CM

## 2017-03-01 ENCOUNTER — Ambulatory Visit: Payer: Self-pay

## 2017-08-30 ENCOUNTER — Emergency Department (HOSPITAL_COMMUNITY): Payer: BLUE CROSS/BLUE SHIELD

## 2017-08-30 ENCOUNTER — Observation Stay (HOSPITAL_COMMUNITY)
Admission: EM | Admit: 2017-08-30 | Discharge: 2017-08-31 | Disposition: A | Discharge status: Home / Self Care | Payer: BLUE CROSS/BLUE SHIELD | Attending: Emergency Medicine | Admitting: Emergency Medicine

## 2017-08-30 ENCOUNTER — Encounter (HOSPITAL_COMMUNITY): Payer: Self-pay | Admitting: Emergency Medicine

## 2017-08-30 ENCOUNTER — Other Ambulatory Visit: Payer: Self-pay

## 2017-08-30 DIAGNOSIS — R531 Weakness: Secondary | ICD-10-CM | POA: Diagnosis not present

## 2017-08-30 DIAGNOSIS — F17290 Nicotine dependence, other tobacco product, uncomplicated: Secondary | ICD-10-CM | POA: Diagnosis not present

## 2017-08-30 DIAGNOSIS — R51 Headache: Secondary | ICD-10-CM

## 2017-08-30 DIAGNOSIS — R4182 Altered mental status, unspecified: Secondary | ICD-10-CM | POA: Diagnosis present

## 2017-08-30 DIAGNOSIS — Z79899 Other long term (current) drug therapy: Secondary | ICD-10-CM | POA: Insufficient documentation

## 2017-08-30 DIAGNOSIS — R519 Headache, unspecified: Secondary | ICD-10-CM | POA: Diagnosis present

## 2017-08-30 DIAGNOSIS — R404 Transient alteration of awareness: Principal | ICD-10-CM

## 2017-08-30 DIAGNOSIS — G9340 Encephalopathy, unspecified: Secondary | ICD-10-CM | POA: Diagnosis present

## 2017-08-30 DIAGNOSIS — F418 Other specified anxiety disorders: Secondary | ICD-10-CM | POA: Diagnosis present

## 2017-08-30 DIAGNOSIS — F1011 Alcohol abuse, in remission: Secondary | ICD-10-CM | POA: Diagnosis present

## 2017-08-30 DIAGNOSIS — R740 Nonspecific elevation of levels of transaminase and lactic acid dehydrogenase [LDH]: Secondary | ICD-10-CM

## 2017-08-30 DIAGNOSIS — R7401 Elevation of levels of liver transaminase levels: Secondary | ICD-10-CM

## 2017-08-30 HISTORY — DX: Pain in thoracic spine: M54.6

## 2017-08-30 HISTORY — DX: Other chronic pain: G89.29

## 2017-08-30 HISTORY — DX: Bipolar disorder, unspecified: F31.9

## 2017-08-30 LAB — COMPREHENSIVE METABOLIC PANEL
ALBUMIN: 4.3 g/dL (ref 3.5–5.0)
ALK PHOS: 56 U/L (ref 38–126)
ALT: 56 U/L — ABNORMAL HIGH (ref 0–44)
AST: 107 U/L — ABNORMAL HIGH (ref 15–41)
Anion gap: 7 (ref 5–15)
BILIRUBIN TOTAL: 0.8 mg/dL (ref 0.3–1.2)
BUN: 13 mg/dL (ref 6–20)
CALCIUM: 9 mg/dL (ref 8.9–10.3)
CO2: 29 mmol/L (ref 22–32)
Chloride: 102 mmol/L (ref 98–111)
Creatinine, Ser: 0.76 mg/dL (ref 0.44–1.00)
GFR calc Af Amer: >60 mL/min (ref >60)
GLUCOSE: 98 mg/dL (ref 70–99)
Potassium: 3.8 mmol/L (ref 3.5–5.1)
Sodium: 138 mmol/L (ref 135–145)
TOTAL PROTEIN: 6.5 g/dL (ref 6.5–8.1)

## 2017-08-30 LAB — CBC
HCT: 43.8 % (ref 36.0–46.0)
Hemoglobin: 14.1 g/dL (ref 12.0–15.0)
MCH: 30.9 pg (ref 26.0–34.0)
MCHC: 32.2 g/dL (ref 30.0–36.0)
MCV: 96.1 fL (ref 78.0–100.0)
PLATELETS: 174 10*3/uL (ref 150–400)
RBC: 4.56 MIL/uL (ref 3.87–5.11)
RDW: 12.6 % (ref 11.5–15.5)
WBC: 8 10*3/uL (ref 4.0–10.5)

## 2017-08-30 LAB — PROTIME-INR
INR: 0.99
PROTHROMBIN TIME: 13 s (ref 11.4–15.2)

## 2017-08-30 LAB — DIFFERENTIAL
Abs Immature Granulocytes: 0 10*3/uL (ref 0.0–0.1)
Basophils Absolute: 0.1 10*3/uL (ref 0.0–0.1)
Basophils Relative: 1 %
EOS PCT: 1 %
Eosinophils Absolute: 0.1 10*3/uL (ref 0.0–0.7)
IMMATURE GRANULOCYTES: 0 %
LYMPHS ABS: 2.5 10*3/uL (ref 0.7–4.0)
LYMPHS PCT: 31 %
MONOS PCT: 8 %
Monocytes Absolute: 0.6 10*3/uL (ref 0.1–1.0)
Neutro Abs: 4.6 10*3/uL (ref 1.7–7.7)
Neutrophils Relative %: 59 %

## 2017-08-30 LAB — I-STAT TROPONIN, ED: TROPONIN I, POC: 0.17 ng/mL — AB (ref 0.00–0.08)

## 2017-08-30 LAB — APTT: aPTT: 31 seconds (ref 24–36)

## 2017-08-30 LAB — I-STAT BETA HCG BLOOD, ED (MC, WL, AP ONLY): I-stat hCG, quantitative: <5 m[IU]/mL (ref <5)

## 2017-08-30 LAB — CBG MONITORING, ED: Glucose-Capillary: 95 mg/dL (ref 70–99)

## 2017-08-30 MED ORDER — METOCLOPRAMIDE HCL 5 MG/ML IJ SOLN
10.0000 mg | Freq: Once | INTRAMUSCULAR | Status: AC
  Administered 2017-08-31: 10 mg via INTRAVENOUS
  Filled 2017-08-30: qty 2

## 2017-08-30 MED ORDER — DIPHENHYDRAMINE HCL 50 MG/ML IJ SOLN
25.0000 mg | Freq: Once | INTRAMUSCULAR | Status: AC
  Administered 2017-08-31: 25 mg via INTRAVENOUS
  Filled 2017-08-30: qty 1

## 2017-08-30 MED ORDER — SODIUM CHLORIDE 0.9 % IV BOLUS (SEPSIS)
1000.0000 mL | Freq: Once | INTRAVENOUS | Status: AC
  Administered 2017-08-31: 1000 mL via INTRAVENOUS

## 2017-08-30 NOTE — ED Triage Notes (Signed)
Pt brought in by boyfriend. LKW sometime this AM when pt left for work, when pt got home from work tonight approx 1730 pt was feeling "weird" Pt states she feels "swimmy headed", C/O HA, and mild L sided sensory deficits, pt isnt making complete sense when talking. Dr. Rush Landmarkegeler in triage, not to call Code Stroke at this time.

## 2017-08-30 NOTE — ED Provider Notes (Signed)
[facility] EMERGENCY DEPARTMENT Provider Note   CSN: [id number] Arrival date & time: 08/30/17  2226     History   Chief Complaint Chief Complaint  Patient presents with  . Altered Mental Status    HPI Heather Hess is a 57 y.o. female.  The history is provided by the patient and a significant other.  Altered Mental Status   This is a new problem. Episode onset: unknown. The problem has been gradually worsening. Associated symptoms include confusion, somnolence and weakness.  Presents with altered mental status, unknown time of onset. Significant other reports that patient went to work in the morning without any issues.  She works in a [employer] during the day She came home and appeared overheated, flushed is reportedly shaking.  Her significant other thought she may be having heatstroke so he gave her a cold bath and may drink water and she seemed just mildly improved.  However she appears sleepy, is reporting a headache, he reports she has mild slurred speech, and appears overall weak. No recent falls or injury.  No travel.  No tick bites. She reports headache, she denies visual changes.  No chest pain or abdominal pain.  No shortness of breath.  She reports feeling globally weak.  Past Medical History:  Diagnosis Date  . Acute kidney injury (HCC)   . Alcohol abuse   . Anxiety   . Chronic back pain   . Depression   . Herniated cervical disc   . Opiate abuse, episodic (HCC)   . Septicemia [facility])     Patient Active Problem List   Diagnosis Date Noted  . Alcohol use disorder, severe, dependence (HCC) 01/28/2016  . EtOH dependence (HCC) 01/28/2016  . Sepsis due to Escherichia coli (E. coli) (HCC) 01/08/2015  . Anxiety 01/08/2015  . AKI (acute kidney injury) (HCC) 01/08/2015  . Alcohol dependence with alcohol-induced mood disorder (HCC)   . Alcohol intoxication (HCC)   . Suicidal thoughts     Past Surgical History:  Procedure Laterality Date  .  ABDOMINAL HYSTERECTOMY    . CHOLECYSTECTOMY    . HEMORRHOID SURGERY    . TONSILLECTOMY       OB History   None      Home Medications    Prior to Admission medications   Medication Sig Start Date End Date Taking? Authorizing Provider  divalproex (DEPAKOTE) 500 MG DR tablet Take 1 tablet (500 mg total) by mouth every 12 (twelve) hours. 02/01/16   Adonis Brook, NP  DULoxetine (CYMBALTA) 60 MG capsule Take 1 capsule (60 mg total) by mouth 2 (two) times daily. 02/01/16   Adonis Brook, NP  hydrOXYzine (ATARAX/VISTARIL) 25 MG tablet Take 1 tablet (25 mg total) by mouth every 6 (six) hours as needed for anxiety. 02/01/16   Adonis Brook, NP  nicotine (NICODERM CQ - DOSED IN MG/24 HOURS) 21 mg/24hr patch Place 1 patch (21 mg total) onto the skin daily. 02/02/16   Adonis Brook, NP  traZODone (DESYREL) 50 MG tablet Take 1 tablet (50 mg total) by mouth at bedtime as needed for sleep (may repeat times one if not effective in one hour). 02/01/16   Adonis Brook, NP    Family History No family history on file.  Social History Social History   Tobacco Use  . Smoking status: Current Every Day Smoker    Packs/day: 0.25    Years: 10.00    Pack years: 2.50    Types: E-cigarettes  . Smokeless tobacco: Never  Used  Substance Use Topics  . Alcohol use: Yes    Alcohol/week: 1.2 oz    Types: 2 Cans of beer per week    Comment: 2 - 24 oz beers (8%alc) per patient  . Drug use: No     Allergies   Nsaids   Review of Systems Review of Systems  Constitutional: Positive for fatigue.  Eyes: Negative for visual disturbance.       Denies visual disturbance, denies diplopia  Respiratory: Negative for shortness of breath.   Cardiovascular: Negative for chest pain.  Neurological: Positive for weakness and headaches. Negative for syncope.  Psychiatric/Behavioral: Positive for confusion.  All other systems reviewed and are negative.    Physical Exam Updated Vital Signs BP (!) 138/96  (BP Location: Right Arm)   Pulse (!) 109   Resp 18   SpO2 99%   Physical Exam  CONSTITUTIONAL: Somnolent, sitting in a dark room, whispering.  She instructed me to lower my voice HEAD: Normocephalic/atraumatic, no signs of trauma EYES: EOMI/PERRL, pupils pinpoint, no nystagmus ENMT: Mucous membranes moist NECK: supple no meningeal signs SPINE/BACK:entire spine nontender CV: S1/S2 noted, no murmurs/rubs/gallops noted LUNGS: Lungs are clear to auscultation bilaterally, no apparent distress ABDOMEN: soft, nontender, no rebound or guarding, bowel sounds noted throughout abdomen GU:no cva tenderness NEURO: Pt is resting with eyes closed, but she answers all questions appropriately  face symmetric, no arm or leg drift is noted Equal 5/5 strength with shoulder abduction, elbow flex/extension, wrist flex/extension in upper extremities Equal 5/5 strength with hip flexion,knee flex/extension, foot dorsi/plantar flexion Cranial nerves 3/4/5/6/09/05/08/11/12 tested and intact Sensation to light touch intact in all extremities EXTREMITIES: pulses normal/equal, full ROM SKIN: warm, color normal PSYCH: flat affect  ED Treatments / Results  Labs (all labs ordered are listed, but only abnormal results are displayed) Labs Reviewed  COMPREHENSIVE METABOLIC PANEL - Abnormal; Notable for the following components:      Result Value   AST 107 (*)    ALT 56 (*)    All other components within normal limits  RAPID URINE DRUG SCREEN, HOSP PERFORMED - Abnormal; Notable for the following components:   Tetrahydrocannabinol POSITIVE (*)    Barbiturates   (*)    Value: Result not available. Reagent lot number recalled by manufacturer.   All other components within normal limits  ACETAMINOPHEN LEVEL - Abnormal; Notable for the following components:   Acetaminophen (Tylenol), Serum <10 (*)    All other components within normal limits  URINALYSIS, ROUTINE W REFLEX MICROSCOPIC - Abnormal; Notable for the  following components:   Color, Urine STRAW (*)    Specific Gravity, Urine 1.002 (*)    All other components within normal limits  VALPROIC ACID LEVEL - Abnormal; Notable for the following components:   Valproic Acid Lvl <10 (*)    All other components within normal limits  I-STAT TROPONIN, ED - Abnormal; Notable for the following components:   Troponin i, poc 0.17 (*)    All other components within normal limits  PROTIME-INR  APTT  CBC  DIFFERENTIAL  TROPONIN I  CK  ETHANOL  SALICYLATE LEVEL  AMMONIA  CBG MONITORING, ED  I-STAT CHEM 8, ED  I-STAT BETA HCG BLOOD, ED ([facility], [facility], [facility] ONLY)    EKG EKG Interpretation  Date/Time:  Wednesday August 30 2017 22:53:19 EDT Ventricular Rate:  74 PR Interval:    QRS Duration: 107 QT Interval:  420 QTC Calculation: 466 R Axis:   81 Text Interpretation:  Sinus rhythm  Biatrial enlargement Interpretation limited secondary to artifact Confirmed by Zadie Rhine ([id number]) on 08/30/2017 11:09:15 PM   Radiology Ct Head Wo Contrast  Result Date: 08/30/2017 CLINICAL DATA:  Headache altered mental status EXAM: CT HEAD WITHOUT CONTRAST TECHNIQUE: Contiguous axial images were obtained from the base of the skull through the vertex without intravenous contrast. COMPARISON:  CT 02/16/2016 FINDINGS: Brain: No evidence of acute infarction, hemorrhage, hydrocephalus, extra-axial collection or mass lesion/mass effect. Vascular: No hyperdense vessel or unexpected calcification. Skull: Normal. Negative for fracture or focal lesion. Sinuses/Orbits: No acute finding. Other: None IMPRESSION: Negative non contrasted CT appearance of the brain. Electronically Signed   By: Jasmine Pang M.D.   On: 08/30/2017 23:23   Dg Chest Port 1 View  Result Date: 08/31/2017 CLINICAL DATA:  Headache.  Left-sided sensory deficits. EXAM: PORTABLE CHEST 1 VIEW COMPARISON:  01/15/2015 FINDINGS: The heart size and mediastinal contours are within normal limits. Both lungs are clear. The  visualized skeletal structures are unremarkable. IMPRESSION: No active disease. Electronically Signed   By: Burman Nieves M.D.   On: 08/31/2017 00:03    Procedures Procedures    Medications Ordered in ED Medications  sodium chloride 0.9 % bolus 1,000 mL (0 mLs Intravenous Stopped 08/31/17 0100)  metoCLOPramide (REGLAN) injection 10 mg (10 mg Intravenous Given 08/31/17 0013)  diphenhydrAMINE (BENADRYL) injection 25 mg (25 mg Intravenous Given 08/31/17 0014)  sodium chloride 0.9 % bolus 1,000 mL (0 mLs Intravenous Stopped 08/31/17 0257)     Initial Impression / Assessment and Plan / ED Course  I have reviewed the triage vital signs and the nursing notes.  Pertinent labs & imaging results that were available during my care of the patient were reviewed by me and considered in my medical decision making (see chart for details).     11:53 PM Patient presented for altered mental status, unknown time of onset.  She went to work during the morning without any issues.  When she arrived home she appeared confused and somnolent, and family felt that she was overheated.  CT head was negative.  Altered mental status work-up pending at this time tPA in stroke considered but not given due to: Onset over 3-4.5hours 1:14 AM Patient appears improved.  No signs of stroke.  Labs are still pending. 3:13 AM Patient with difficulty walking, she had an unsteady gait.  I am also told that she failed  swallow screen.  Will admit for stroke work-up Discussed with Dr. Antionette Char for admission  Final Clinical Impressions(s) / ED Diagnoses   Final diagnoses:  Transient alteration of awareness  Weakness    ED Discharge Orders    None       Zadie Rhine, MD 08/31/17 [phone]

## 2017-08-31 ENCOUNTER — Other Ambulatory Visit: Payer: Self-pay

## 2017-08-31 ENCOUNTER — Observation Stay (HOSPITAL_COMMUNITY): Payer: BLUE CROSS/BLUE SHIELD

## 2017-08-31 ENCOUNTER — Encounter (HOSPITAL_COMMUNITY): Payer: Self-pay | Admitting: Family Medicine

## 2017-08-31 ENCOUNTER — Other Ambulatory Visit (HOSPITAL_COMMUNITY): Payer: Self-pay

## 2017-08-31 DIAGNOSIS — F1011 Alcohol abuse, in remission: Secondary | ICD-10-CM | POA: Diagnosis not present

## 2017-08-31 DIAGNOSIS — G9340 Encephalopathy, unspecified: Secondary | ICD-10-CM | POA: Diagnosis not present

## 2017-08-31 DIAGNOSIS — R531 Weakness: Secondary | ICD-10-CM

## 2017-08-31 DIAGNOSIS — F418 Other specified anxiety disorders: Secondary | ICD-10-CM

## 2017-08-31 DIAGNOSIS — G4489 Other headache syndrome: Secondary | ICD-10-CM | POA: Diagnosis not present

## 2017-08-31 DIAGNOSIS — R519 Headache, unspecified: Secondary | ICD-10-CM | POA: Diagnosis present

## 2017-08-31 DIAGNOSIS — R51 Headache: Secondary | ICD-10-CM

## 2017-08-31 LAB — CK: Total CK: 213 U/L (ref 38–234)

## 2017-08-31 LAB — COMPREHENSIVE METABOLIC PANEL
ALK PHOS: 63 U/L (ref 38–126)
ALT: 198 U/L — ABNORMAL HIGH (ref 0–44)
ANION GAP: 3 — AB (ref 5–15)
AST: 318 U/L — ABNORMAL HIGH (ref 15–41)
Albumin: 3.5 g/dL (ref 3.5–5.0)
BUN: 11 mg/dL (ref 6–20)
CALCIUM: 8.1 mg/dL — AB (ref 8.9–10.3)
CO2: 27 mmol/L (ref 22–32)
Chloride: 111 mmol/L (ref 98–111)
Creatinine, Ser: 0.66 mg/dL (ref 0.44–1.00)
GFR calc Af Amer: >60 mL/min (ref >60)
GFR calc non Af Amer: >60 mL/min (ref >60)
GLUCOSE: 90 mg/dL (ref 70–99)
POTASSIUM: 3.9 mmol/L (ref 3.5–5.1)
Sodium: 141 mmol/L (ref 135–145)
Total Bilirubin: 0.7 mg/dL (ref 0.3–1.2)
Total Protein: 5.4 g/dL — ABNORMAL LOW (ref 6.5–8.1)

## 2017-08-31 LAB — TSH: TSH: 2.847 u[IU]/mL (ref 0.350–4.500)

## 2017-08-31 LAB — URINALYSIS, ROUTINE W REFLEX MICROSCOPIC
Bilirubin Urine: NEGATIVE
GLUCOSE, UA: NEGATIVE mg/dL
HGB URINE DIPSTICK: NEGATIVE
Ketones, ur: NEGATIVE mg/dL
LEUKOCYTES UA: NEGATIVE
Nitrite: NEGATIVE
PH: 6 (ref 5.0–8.0)
Protein, ur: NEGATIVE mg/dL
Specific Gravity, Urine: 1.002 — ABNORMAL LOW (ref 1.005–1.030)

## 2017-08-31 LAB — TROPONIN I: Troponin I: <0.03 ng/mL (ref <0.03)

## 2017-08-31 LAB — SALICYLATE LEVEL

## 2017-08-31 LAB — RAPID URINE DRUG SCREEN, HOSP PERFORMED
AMPHETAMINES: NOT DETECTED
Benzodiazepines: NOT DETECTED
Cocaine: NOT DETECTED
OPIATES: NOT DETECTED
TETRAHYDROCANNABINOL: POSITIVE — AB

## 2017-08-31 LAB — VALPROIC ACID LEVEL: Valproic Acid Lvl: <10 ug/mL — ABNORMAL LOW (ref 50.0–100.0)

## 2017-08-31 LAB — ACETAMINOPHEN LEVEL: Acetaminophen (Tylenol), Serum: <10 ug/mL — ABNORMAL LOW (ref 10–30)

## 2017-08-31 LAB — VITAMIN B12: Vitamin B-12: 1164 pg/mL — ABNORMAL HIGH (ref 180–914)

## 2017-08-31 LAB — AMMONIA: Ammonia: 33 umol/L (ref 9–35)

## 2017-08-31 LAB — ETHANOL: Alcohol, Ethyl (B): <10 mg/dL (ref <10)

## 2017-08-31 MED ORDER — ONDANSETRON HCL 4 MG/2ML IJ SOLN: 4.0000 mg | Freq: Four times a day (QID) | INTRAMUSCULAR | Status: DC | PRN

## 2017-08-31 MED ORDER — SODIUM CHLORIDE 0.9 % IV BOLUS
1000.0000 mL | Freq: Once | INTRAVENOUS | Status: AC
  Administered 2017-08-31: 1000 mL via INTRAVENOUS

## 2017-08-31 MED ORDER — PROMETHAZINE HCL 25 MG/ML IJ SOLN
25.0000 mg | Freq: Once | INTRAMUSCULAR | Status: AC
  Administered 2017-08-31: 25 mg via INTRAVENOUS
  Filled 2017-08-31: qty 1

## 2017-08-31 MED ORDER — ACETAMINOPHEN 650 MG RE SUPP: 650.0000 mg | Freq: Four times a day (QID) | RECTAL | Status: DC | PRN

## 2017-08-31 MED ORDER — SENNOSIDES-DOCUSATE SODIUM 8.6-50 MG PO TABS: 1.0000 | ORAL_TABLET | Freq: Every evening | ORAL | Status: DC | PRN

## 2017-08-31 MED ORDER — SODIUM CHLORIDE 0.9 % IV BOLUS (SEPSIS)
1000.0000 mL | Freq: Once | INTRAVENOUS | Status: AC
  Administered 2017-08-31: 1000 mL via INTRAVENOUS

## 2017-08-31 MED ORDER — POTASSIUM CHLORIDE IN NACL 20-0.9 MEQ/L-% IV SOLN
INTRAVENOUS | Status: AC
  Administered 2017-08-31: 06:00:00 via INTRAVENOUS
  Filled 2017-08-31 (×2): qty 1000

## 2017-08-31 MED ORDER — ENOXAPARIN SODIUM 40 MG/0.4ML ~~LOC~~ SOLN: 40.0000 mg | SUBCUTANEOUS | Status: DC

## 2017-08-31 MED ORDER — KETOROLAC TROMETHAMINE 30 MG/ML IJ SOLN
15.0000 mg | Freq: Four times a day (QID) | INTRAMUSCULAR | Status: DC | PRN
  Administered 2017-08-31: 30 mg via INTRAVENOUS
  Filled 2017-08-31: qty 1

## 2017-08-31 MED ORDER — ONDANSETRON HCL 4 MG PO TABS: 4.0000 mg | ORAL_TABLET | Freq: Four times a day (QID) | ORAL | Status: DC | PRN

## 2017-08-31 MED ORDER — SODIUM CHLORIDE 0.9% FLUSH
3.0000 mL | Freq: Two times a day (BID) | INTRAVENOUS | Status: DC
  Administered 2017-08-31 (×2): 3 mL via INTRAVENOUS

## 2017-08-31 MED ORDER — ACETAMINOPHEN 325 MG PO TABS: 650.0000 mg | ORAL_TABLET | Freq: Four times a day (QID) | ORAL | Status: DC | PRN

## 2017-08-31 NOTE — Evaluation (Signed)
Occupational Therapy Evaluation Patient Details Name: Heather MarbleLora Hess MRN: [id number] DOB: 1960-03-08 Today's Date: 08/31/2017    History of Present Illness Heather MarbleLora Hess is a 57 y.o. female with medical history significant for alcohol abuse in remission (2 years) and depression with anxiety, now presenting to the emergency department for evaluation of headache, generalized weakness, confusion, and somnolence. MRI and CT negative for acute abnormality.   Clinical Impression   PTA Pt independent in ADL and mobility. 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 Pt is currently supervision in hospital for all ADL but does not require assist for any ADL UB or LB. In room ambulation with no LOB or dizziness. Pt with no questions or concerns at the end of session. OT to sign off at this time. Thank you for the opportunity to serve this patient.     Follow Up Recommendations  No OT follow up;Supervision - Intermittent    Equipment Recommendations  None recommended by OT    Recommendations for Other Services       Precautions / Restrictions Precautions Precautions: Fall Restrictions Weight Bearing Restrictions: No      Mobility Bed Mobility Overal bed mobility: Modified Independent             General bed mobility comments: increased time  Transfers Overall transfer level: Modified independent Equipment used: None                  Balance Overall balance assessment: Needs assistance Sitting-balance support: No upper extremity supported;Feet supported Sitting balance-Leahy Scale: Good     Standing balance support: No upper extremity supported;During functional activity Standing balance-Leahy Scale: Fair                             ADL either performed or assessed with clinical judgement   ADL Overall ADL's : Needs assistance/impaired Eating/Feeding: NPO   Grooming: Wash/dry face;Oral  care;Supervision/safety;Standing Grooming Details (indicate cue type and reason): sink level Upper Body Bathing: Supervision/ safety   Lower Body Bathing: Supervison/ safety   Upper Body Dressing : Supervision/safety   Lower Body Dressing: Supervision/safety;Sit to/from stand   Toilet Transfer: Supervision/safety;Ambulation   Toileting- Clothing Manipulation and Hygiene: Supervision/safety;Sit to/from stand   Tub/ Shower Transfer: Tub transfer;Min guard;Ambulation   Functional mobility during ADLs: Supervision/safety       Vision Baseline Vision/History: Wears glasses Wears Glasses: At all times Patient Visual Report: No change from baseline(I do need to see my MD for a regular eye check up) Vision Assessment?: No apparent visual deficits     Perception     Praxis      Pertinent Vitals/Pain Pain Assessment: 0-10 Pain Score: 4  Pain Location: headache Pain Descriptors / Indicators: Headache Pain Intervention(s): Monitored during session;Repositioned     Hand Dominance Right   Extremity/Trunk Assessment Upper Extremity Assessment Upper Extremity Assessment: Overall WFL for tasks assessed(stitches on L hand by pointer finger)   Lower Extremity Assessment Lower Extremity Assessment: Defer to PT evaluation       Communication Communication Communication: No difficulties   Cognition Arousal/Alertness: Awake/alert Behavior During Therapy: WFL for tasks assessed/performed Overall Cognitive Status: Within Functional Limits for tasks assessed                                     General Comments       Exercises  Shoulder Instructions      Home Living Family/patient expects to be discharged to:: Private residence Living Arrangements: Spouse/significant other   Type of Home: Apartment Home Access: Stairs to enter Secretary/administrator of Steps: 13 Entrance Stairs-Rails: Right;Left;Can reach both Home Layout: One level     Bathroom  Shower/Tub: IT trainer: Standard     Home Equipment: None          Prior Functioning/Environment Level of Independence: Independent        Comments: does not drive        OT Problem List:        OT Treatment/Interventions:      OT Goals(Current goals can be found in the care plan section) Acute Rehab OT Goals Patient Stated Goal: to get home OT Goal Formulation: With patient Time For Goal Achievement: 09/14/17 Potential to Achieve Goals: Good  OT Frequency:     Barriers to D/C:            Co-evaluation              AM-PAC PT "6 Clicks" Daily Activity     Outcome Measure Help from another person eating meals?: None Help from another person taking care of personal grooming?: None Help from another person toileting, which includes using toliet, bedpan, or urinal?: None Help from another person bathing (including washing, rinsing, drying)?: None Help from another person to put on and taking off regular upper body clothing?: None Help from another person to put on and taking off regular lower body clothing?: None 6 Click Score: 24   End of Session Equipment Utilized During Treatment: Gait belt Nurse Communication: Mobility status  Activity Tolerance: Patient tolerated treatment well Patient left: in chair;with call bell/phone within reach                   Time: 0951-1016 OT Time Calculation (min): 25 min Charges:  OT General Charges $OT Visit: 1 Visit OT Evaluation $OT Eval Low Complexity: 1 Low OT Treatments $Self Care/Home Management : 8-22 mins G-Codes:     Sherryl Manges OTR/L [phone] Heather Hess Heather Hess 08/31/2017, 10:46 AM

## 2017-08-31 NOTE — Evaluation (Signed)
Clinical/Bedside Swallow Evaluation Patient Details  Name: Heather Hess MRN: [id number] Date of Birth: 1961/01/13  Today's Date: 08/31/2017 Time: SLP Start Time (ACUTE ONLY): 1130 SLP Stop Time (ACUTE ONLY): 1150 SLP Time Calculation (min) (ACUTE ONLY): 20 min  Past Medical History:  Past Medical History:  Diagnosis Date  . Acute kidney injury (HCC)   . Alcohol abuse   . Anxiety   . Chronic back pain   . Depression   . Herniated cervical disc   . Opiate abuse, episodic (HCC)   . Septicemia [facility](HCC)    Past Surgical History:  Past Surgical History:  Procedure Laterality Date  . ABDOMINAL HYSTERECTOMY    . CHOLECYSTECTOMY    . HEMORRHOID SURGERY    . TONSILLECTOMY     HPI:  Heather Hess is a 57 y.o. female with medical history significant for alcohol abuse in remission (2 years) and depression with anxiety, now presenting to the emergency department for evaluation of headache, generalized weakness, confusion, and somnolence. MRI and CT negative for acute abnormality   Assessment / Plan / Recommendation Clinical Impression  Pt presents with normal oropharyngeal swallow with adequate mastication, brisk swallow response, no s/s of aspiration.  Recommend resuming a regular diet, thin liquids.  Pt is good historian; admits feeling groggy today.  Speech was initially notable for imprecision, but as swallow eval progressed and pt ate/drank, it became clearer.  No formalized speech/language eval is warranted.  SLP will sign off. No further f/u recommended.  SLP Visit Diagnosis: Dysphagia, unspecified (R13.10)    Aspiration Risk  No limitations    Diet Recommendation     Medication Administration: Whole meds with liquid    Other  Recommendations Oral Care Recommendations: Oral care BID   Follow up Recommendations None      Frequency and Duration            Prognosis        Swallow Study   General Date of Onset: 08/30/17 HPI: Heather Hess is a 57 y.o. female  with medical history significant for alcohol abuse in remission (2 years) and depression with anxiety, now presenting to the emergency department for evaluation of headache, generalized weakness, confusion, and somnolence. MRI and CT negative for acute abnormality Type of Study: Bedside Swallow Evaluation Previous Swallow Assessment: no Diet Prior to this Study: NPO Temperature Spikes Noted: No Respiratory Status: Room air History of Recent Intubation: No Behavior/Cognition: Alert;Cooperative Oral Cavity Assessment: Within Functional Limits Oral Care Completed by SLP: No Oral Cavity - Dentition: Dentures, top;Dentures, bottom Vision: Functional for self-feeding Self-Feeding Abilities: Able to feed self Patient Positioning: Upright in bed Baseline Vocal Quality: Normal Volitional Cough: Strong Volitional Swallow: Able to elicit    Oral/Motor/Sensory Function Overall Oral Motor/Sensory Function: Within functional limits   Ice Chips Ice chips: Within functional limits   Thin Liquid Thin Liquid: Within functional limits    Nectar Thick Nectar Thick Liquid: Not tested   Honey Thick Honey Thick Liquid: Not tested   Puree Puree: Within functional limits   Solid   GO   Solid: Within functional limits        Heather Hess 08/31/2017,11:53 AM

## 2017-08-31 NOTE — ED Notes (Signed)
Pt  tolerated ambulating poorly. Pt had unsteady gait and felt dizziness. Pt ambulated in room

## 2017-08-31 NOTE — Plan of Care (Signed)
Adequate for discharge.

## 2017-08-31 NOTE — Progress Notes (Signed)
OT Cancellation Note  Patient Details Name: Jodi MarbleLora Faiella MRN: [id number] DOB: 07-15-1960   Cancelled Treatment:    Reason Eval/Treat Not Completed: Patient at procedure or test/ unavailable(ultrasound)  Evern BioLaura J Kiyra Slaubaugh 08/31/2017, 8:36 AM  Sherryl MangesLaura Calyn Sivils OTR/L [phone]

## 2017-08-31 NOTE — Progress Notes (Signed)
PROGRESS NOTE    Heather Hess  WUJ:[id number] DOB: 04/14/1960 DOA: 08/30/2017 PCP: Teena Irani, PA-C      Brief Narrative:  Heather Hess is a 57 y.o. F with hx alcohol use d/o in remission, depression who presents with severe headache, confusion, vomiting.  Patient was in usual state of health on day of admission, worked all day as a Equities trader, and on the drive home gradually developed severe right-sided "railroad spike in the head" headache.  When she got home she appeared flushed, somewhat confused, photophobic, and vomited, so her husband brought her to the ER.   Assessment & Plan:  Headache, likely migraine Denies history of frequent headches, but chart review shows and husband confirms she had a severe headache in 2017 that brought her to the ER, at that time was found to have complex migraine.  CT head normal.  MRI brain normal, no stroke, rules out SAH.   -IV fluids -IV Phenergan 25 mg once   Altered mental status Urine drug screen positive for THC only, ethanol negative.  Urinalysis clear.  MRI normal.  Ammonia normal. -Follow-up RPR, TSH, B12  Transaminitis Unclear cause -Follow-up hepatitis serologies, ceruloplasmin -Obtain ultrasound of the abdomen  Depression -Hold home antidepressants for now  DVT prophylaxis: Lonvenox Code Status: FULL Family Communication: None present MDM and disposition Plan: This is a no charge note.  For further details, please see H&P by my partner Dr. Antionette Char from earlier today.  The below labs and imaging reports were reviewed and summarized above.    The patient was admitted with severe headche.  MRI negative for stroke, SAH.  Have low suspicion for either, givne prior history of complex migraine, this is favored.  Will give diagnostic and therapeutic phenergan.    Objective: Vitals:   08/31/17 0145 08/31/17 0334 08/31/17 0411 08/31/17 0613  BP: 115/64 115/72 131/82 112/71  Pulse: 62 (!) 58 64   Resp: 10 16 18      Temp:   97.6 F (36.4 C) 97.7 F (36.5 C)  TempSrc:   Oral Oral  SpO2: 96% 94% 100% 97%  Weight:   69.2 kg (152 lb 8.9 oz)   Height:   5\' 7"  (1.702 m)     Intake/Output Summary (Last 24 hours) at 08/31/2017 0906 Last data filed at 08/31/2017 0257 Gross per 24 hour  Intake 2000 ml  Output -  Net 2000 ml   Filed Weights   08/31/17 0411  Weight: 69.2 kg (152 lb 8.9 oz)    Examination: The patient was seen and examined.      Data Reviewed: I have personally reviewed following labs and imaging studies:  CBC: Recent Labs  Lab 08/30/17 2255  WBC 8.0  NEUTROABS 4.6  HGB 14.1  HCT 43.8  MCV 96.1  PLT 174   Basic Metabolic Panel: Recent Labs  Lab 08/30/17 2255 08/31/17 0348  NA 138 141  K 3.8 3.9  CL 102 111  CO2 29 27  GLUCOSE 98 90  BUN 13 11  CREATININE 0.76 0.66  CALCIUM 9.0 8.1*   GFR: Estimated Creatinine Clearance: 77.3 mL/min (by C-G formula based on SCr of 0.66 mg/dL). Liver Function Tests: Recent Labs  Lab 08/30/17 2255 08/31/17 0348  AST 107* 318*  ALT 56* 198*  ALKPHOS 56 63  BILITOT 0.8 0.7  PROT 6.5 5.4*  ALBUMIN 4.3 3.5   No results for input(s): LIPASE, AMYLASE in the last 168 hours. Recent Labs  Lab 08/31/17 0000  AMMONIA 33  Coagulation Profile: Recent Labs  Lab 08/30/17 2255  INR 0.99   Cardiac Enzymes: Recent Labs  Lab 08/30/17 2343  CKTOTAL 213  TROPONINI <0.03   BNP (last 3 results) No results for input(s): PROBNP in the last 8760 hours. HbA1C: No results for input(s): HGBA1C in the last 72 hours. CBG: Recent Labs  Lab 08/30/17 2234  GLUCAP 95   Lipid Profile: No results for input(s): CHOL, HDL, LDLCALC, TRIG, CHOLHDL, LDLDIRECT in the last 72 hours. Thyroid Function Tests: Recent Labs    08/31/17 0348  TSH 2.847   Anemia Panel: Recent Labs    08/31/17 0348  VITAMINB12 1,164*   Urine analysis:    Component Value Date/Time   COLORURINE STRAW (A) 08/30/2017 2333   APPEARANCEUR CLEAR 08/30/2017  2333   LABSPEC 1.002 (L) 08/30/2017 2333   PHURINE 6.0 08/30/2017 2333   GLUCOSEU NEGATIVE 08/30/2017 2333   HGBUR NEGATIVE 08/30/2017 2333   BILIRUBINUR NEGATIVE 08/30/2017 2333   KETONESUR NEGATIVE 08/30/2017 2333   PROTEINUR NEGATIVE 08/30/2017 2333   UROBILINOGEN 1.0 01/08/2015 0030   NITRITE NEGATIVE 08/30/2017 2333   LEUKOCYTESUR NEGATIVE 08/30/2017 2333   Sepsis Labs: @LABRCNTIP (procalcitonin:4,lacticacidven:4)  )No results found for this or any previous visit (from the past 240 hour(s)).       Radiology Studies: Ct Head Wo Contrast  Result Date: 08/30/2017 CLINICAL DATA:  Headache altered mental status EXAM: CT HEAD WITHOUT CONTRAST TECHNIQUE: Contiguous axial images were obtained from the base of the skull through the vertex without intravenous contrast. COMPARISON:  CT 02/16/2016 FINDINGS: Brain: No evidence of acute infarction, hemorrhage, hydrocephalus, extra-axial collection or mass lesion/mass effect. Vascular: No hyperdense vessel or unexpected calcification. Skull: Normal. Negative for fracture or focal lesion. Sinuses/Orbits: No acute finding. Other: None IMPRESSION: Negative non contrasted CT appearance of the brain. Electronically Signed   By: Jasmine PangKim  Fujinaga M.D.   On: 08/30/2017 23:23   Mr Brain Wo Contrast  Result Date: 08/31/2017 CLINICAL DATA:  Confusion and weakness.  Somnolence. EXAM: MRI HEAD WITHOUT CONTRAST TECHNIQUE: Multiplanar, multiecho pulse sequences of the brain and surrounding structures were obtained without intravenous contrast. COMPARISON:  Head CT 08/30/2017 FINDINGS: Brain: Brain has normal appearance without evidence of malformation, atrophy, old or acute small or large vessel infarction, mass lesion, hemorrhage, hydrocephalus or extra-axial collection. Vascular: Major vessels at the base of the brain show flow. Venous sinuses appear patent. Skull and upper cervical spine: Normal. Sinuses/Orbits: Clear/normal. Other: None significant. IMPRESSION:  Normal examination. Electronically Signed   By: Paulina FusiMark  Shogry M.D.   On: 08/31/2017 07:32   [place] Abdomen Complete  Result Date: 08/31/2017 CLINICAL DATA:  Elevated LFTs EXAM: ABDOMEN ULTRASOUND COMPLETE COMPARISON:  01/15/2015 FINDINGS: Gallbladder: Surgically removed Common bile duct: Diameter: 4.4 mm Liver: No focal lesion identified. Within normal limits in parenchymal echogenicity. Portal vein is patent on color Doppler imaging with normal direction of blood flow towards the liver. IVC: No abnormality visualized. Pancreas: Visualized portion unremarkable. Spleen: Size and appearance within normal limits. Right Kidney: Length: 11.6 cm. Echogenicity within normal limits. No mass or hydronephrosis visualized. Left Kidney: Length: 10.8 cm. Echogenicity within normal limits. No mass or hydronephrosis visualized. Abdominal aorta: No aneurysm visualized. Other findings: None. IMPRESSION: Status post cholecystectomy. No acute abnormality noted. Electronically Signed   By: Alcide CleverMark  Lukens M.D.   On: 08/31/2017 08:52   Dg Chest Port 1 View  Result Date: 08/31/2017 CLINICAL DATA:  Headache.  Left-sided sensory deficits. EXAM: PORTABLE CHEST 1 VIEW COMPARISON:  01/15/2015 FINDINGS: The heart  size and mediastinal contours are within normal limits. Both lungs are clear. The visualized skeletal structures are unremarkable. IMPRESSION: No active disease. Electronically Signed   By: Burman Nieves M.D.   On: 08/31/2017 00:03        Scheduled Meds: . enoxaparin (LOVENOX) injection  40 mg Subcutaneous Q24H  . sodium chloride flush  3 mL Intravenous Q12H   Continuous Infusions: . 0.9 % NaCl with KCl 20 mEq / L 100 mL/hr at 08/31/17 0612     LOS: 0 days    Time spent: 25 mintues    Alberteen Sam, MD [facility] 08/31/2017, 9:06 AM     Pager [phone] --- please page though AMION:  [email or web address] Password [email or web address] If 7PM-7AM, please contact night-coverage

## 2017-08-31 NOTE — Evaluation (Signed)
Physical Therapy Evaluation Patient Details Name: Heather MarbleLora Hess MRN: [id number] DOB: [birth year] Today's Date: 08/31/2017   History of Present Illness  Heather Hess is a 57 y.o. female with medical history significant for alcohol abuse in remission (2 years) and depression with anxiety, now presenting to the emergency department for evaluation of headache, generalized weakness, confusion, and somnolence. MRI and CT negative for acute abnormality.  Clinical Impression  Orders received for PT evaluation. Patient demonstrates deficits in functional mobility as indicated below. Will benefit from continued skilled PT to address deficits and maximize function. Will see as indicated and progress as tolerated.      Follow Up Recommendations Outpatient PT    Equipment Recommendations  None recommended by PT    Recommendations for Other Services       Precautions / Restrictions Precautions Precautions: Fall Restrictions Weight Bearing Restrictions: No      Mobility  Bed Mobility Overal bed mobility: Modified Independent             General bed mobility comments: increased time  Transfers Overall transfer level: Modified independent Equipment used: None                Ambulation/Gait Ambulation/Gait assistance: Supervision Gait Distance (Feet): 210 Feet Assistive device: None Gait Pattern/deviations: Step-through pattern;Decreased stride length;Drifts right/left;Narrow base of support Gait velocity: decreased Gait velocity interpretation: <1.8 ft/sec, indicate of risk for recurrent falls General Gait Details: patient with some noted instability and alterations in gait/speed control. Decreased coordination with slow speeds  Stairs Stairs: Yes Stairs assistance: Supervision Stair Management: Two rails Number of Stairs: 5 General stair comments: no physical assist, supervision for safety  Wheelchair Mobility    Modified Rankin (Stroke Patients Only) Modified  Rankin (Stroke Patients Only) Pre-Morbid Rankin Score: No symptoms Modified Rankin: Slight disability     Balance Overall balance assessment: Needs assistance Sitting-balance support: No upper extremity supported;Feet supported Sitting balance-Leahy Scale: Good     Standing balance support: No upper extremity supported;During functional activity Standing balance-Leahy Scale: Fair                   Standardized Balance Assessment Standardized Balance Assessment : Dynamic Gait Index   Dynamic Gait Index Level Surface: Mild Impairment Change in Gait Speed: Mild Impairment Gait with Horizontal Head Turns: Moderate Impairment Gait with Vertical Head Turns: Mild Impairment Gait and Pivot Turn: Mild Impairment Step Over Obstacle: Mild Impairment Step Around Obstacles: Mild Impairment Steps: Mild Impairment Total Score: 15       Pertinent Vitals/Pain Pain Assessment: 0-10 Pain Score: 4  Pain Location: headache Pain Descriptors / Indicators: Headache Pain Intervention(s): Monitored during session;Repositioned    Home Living Family/patient expects to be discharged to:: Private residence Living Arrangements: Spouse/significant other   Type of Home: Apartment Home Access: Stairs to enter Entrance Stairs-Rails: Right;Left;Can reach both Secretary/administratorntrance Stairs-Number of Steps: 13 Home Layout: One level Home Equipment: None      Prior Function Level of Independence: Independent         Comments: does not drive     Hand Dominance   Dominant Hand: Right    Extremity/Trunk Assessment   Upper Extremity Assessment Upper Extremity Assessment: Overall WFL for tasks assessed(stitches on L hand by pointer finger)    Lower Extremity Assessment Lower Extremity Assessment: Defer to PT evaluation       Communication   Communication: No difficulties  Cognition Arousal/Alertness: Awake/alert Behavior During Therapy: WFL for tasks assessed/performed Overall Cognitive  Status: Within Functional Limits  for tasks assessed                                        General Comments      Exercises     Assessment/Plan    PT Assessment Patient needs continued PT services  PT Problem List Decreased balance;Decreased mobility;Decreased coordination       PT Treatment Interventions DME instruction;Gait training;Stair training;Functional mobility training;Therapeutic activities;Therapeutic exercise;Balance training;Neuromuscular re-education    PT Goals (Current goals can be found in the Care Plan section)  Acute Rehab PT Goals Patient Stated Goal: to get home PT Goal Formulation: With patient Time For Goal Achievement: 09/14/17 Potential to Achieve Goals: Good    Frequency Min 3X/week   Barriers to discharge        Co-evaluation               AM-PAC PT "6 Clicks" Daily Activity  Outcome Measure Difficulty turning over in bed (including adjusting bedclothes, sheets and blankets)?: None Difficulty moving from lying on back to sitting on the side of the bed? : None Difficulty sitting down on and standing up from a chair with arms (e.g., wheelchair, bedside commode, etc,.)?: None Help needed moving to and from a bed to chair (including a wheelchair)?: None Help needed walking in hospital room?: A Little Help needed climbing 3-5 steps with a railing? : A Little 6 Click Score: 22    End of Session Equipment Utilized During Treatment: Gait belt Activity Tolerance: Patient tolerated treatment well Patient left: in chair;with call bell/phone within reach Nurse Communication: Mobility status PT Visit Diagnosis: Unsteadiness on feet (R26.81);Difficulty in walking, not elsewhere classified (R26.2);Other symptoms and signs involving the nervous system (R29.898)    Time: 1610-9604 PT Time Calculation (min) (ACUTE ONLY): 19 min   Charges:   PT Evaluation $PT Eval Moderate Complexity: 1 Mod     PT G Codes:        Charlotte Crumb, PT DPT  Board Certified Neurologic Specialist [phone]   Fabio Asa 08/31/2017, 11:07 AM

## 2017-08-31 NOTE — H&P (Signed)
History and Physical    Heather Hess ZOX:[id number] DOB: 1960/09/22 DOA: 08/30/2017  PCP: Teena Irani, PA-C   Patient coming from: Home  Chief Complaint: Gen weakness, headache, confusion, somnolence   HPI: Heather Hess is a 57 y.o. female with medical history significant for alcohol abuse in remission and depression with anxiety, now presenting to the emergency department for evaluation of headache, generalized weakness, confusion, and somnolence.  Patient was reportedly in her usual state of health when her husband dropped her off at work today, but when returning home at approximately 5:30 PM, she was noted to be confused, somnolent, and complaining of headache.  She denies having any difficulty at work, but reports working a longer than usual shift and developing a headache with transient visual disturbance described as briefly "seeing colors."  Since that time, she has had generalized weakness but denies asymmetry with this.  She denies frequent headaches, but reports a similar presentation several years ago when she had a headache and strokelike symptoms with work-up negative for CVA.  There are no chronic neurologic problems.  She reports abstinence from alcohol for about 2 years now.  No recent fevers or chills, no cough, no chest pain or palpitations.  ED Course: Upon arrival to the ED, patient is found to be afebrile, saturating well on room air, slightly tachycardic initially, and with stable blood pressure.  EKG features a sinus rhythm with nonspecific IVCD.  Noncontrast head CT is a normal study and chest x-ray is negative for acute cardiopulmonary disease.  Chemistry panel is notable for mild elevations in serum transaminases and CBC is unremarkable. Ammonia and CK levels are normal, UDS positive for THC, and acetaminophen, salicylate, and ethanol levels are undetectable.  Patient was treated with 2 L normal saline and headache cocktail in the ED.  She reports significant  improvement in her symptoms, but is not back to her baseline.  She will be observed for ongoing evaluation and management.   Review of Systems:  All other systems reviewed and apart from HPI, are negative.  Past Medical History:  Diagnosis Date  . Acute kidney injury (HCC)   . Alcohol abuse   . Anxiety   . Chronic back pain   . Depression   . Herniated cervical disc   . Opiate abuse, episodic (HCC)   . Septicemia [facility])     Past Surgical History:  Procedure Laterality Date  . ABDOMINAL HYSTERECTOMY    . CHOLECYSTECTOMY    . HEMORRHOID SURGERY    . TONSILLECTOMY       reports that she has been smoking e-cigarettes.  She has a 2.50 pack-year smoking history. She has never used smokeless tobacco. She reports that she drinks about 1.2 oz of alcohol per week. She reports that she does not use drugs.  Allergies  Allergen Reactions  . Nsaids Hives    Patient takes Mobic at home    History reviewed. No pertinent family history.   Prior to Admission medications   Medication Sig Start Date End Date Taking? Authorizing Provider  DULoxetine (CYMBALTA) 60 MG capsule Take 1 capsule (60 mg total) by mouth 2 (two) times daily. 02/01/16  Yes Adonis Brook, NP  hydrOXYzine (ATARAX/VISTARIL) 25 MG tablet Take 1 tablet (25 mg total) by mouth every 6 (six) hours as needed for anxiety. 02/01/16  Yes Adonis Brook, NP  meloxicam (MOBIC) 15 MG tablet Take 15 mg by mouth daily as needed for pain.   Yes [provider]  traMADol Janean Sark)  50 MG tablet Take 50 mg by mouth every 6 (six) hours as needed for moderate pain.   Yes [provider]  vitamin k 100 MCG tablet Take 100 mcg by mouth daily.   Yes [provider]  traZODone (DESYREL) 50 MG tablet Take 1 tablet (50 mg total) by mouth at bedtime as needed for sleep (may repeat times one if not effective in one hour). Patient not taking: Reported on 08/31/2017 02/01/16   Adonis Brook, NP    Physical Exam: Vitals:    08/31/17 0045 08/31/17 0100 08/31/17 0130 08/31/17 0145  BP: 112/63 113/66  115/64  Pulse: 73 63 64 62  Resp: 13 16 (!) 22 10  Temp:      TempSrc:      SpO2: 97% 96% 97% 96%      Constitutional: NAD, calm  Eyes: PERTLA, lids and conjunctivae normal ENMT: Mucous membranes are dry. Posterior pharynx clear of any exudate or lesions.   Neck: normal, supple, no masses, no thyromegaly Respiratory: clear to auscultation bilaterally, no wheezing, no crackles. Normal respiratory effort.   Cardiovascular: S1 & S2 heard, regular rate and rhythm. No extremity edema.  Abdomen: No distension, soft, no tenderness. Bowel sounds normal.  Musculoskeletal: no clubbing / cyanosis. No joint deformity upper and lower extremities.   Skin: no significant rashes, lesions, ulcers. Poor turgor. Neurologic: PEERL, EOMI, slight dysarthria. Somnolent, but easily roused and fully oriented. Sensation to light touch intact. Patellar DTRs normal. Mild global weakness and lethargy.   Psychiatric: Calm. Cooperative.      Labs on Admission: I have personally reviewed following labs and imaging studies  CBC: Recent Labs  Lab 08/30/17 2255  WBC 8.0  NEUTROABS 4.6  HGB 14.1  HCT 43.8  MCV 96.1  PLT 174   Basic Metabolic Panel: Recent Labs  Lab 08/30/17 2255  NA 138  K 3.8  CL 102  CO2 29  GLUCOSE 98  BUN 13  CREATININE 0.76  CALCIUM 9.0   GFR: CrCl cannot be calculated (Unknown ideal weight.). Liver Function Tests: Recent Labs  Lab 08/30/17 2255  AST 107*  ALT 56*  ALKPHOS 56  BILITOT 0.8  PROT 6.5  ALBUMIN 4.3   No results for input(s): LIPASE, AMYLASE in the last 168 hours. Recent Labs  Lab 08/31/17 0000  AMMONIA 33   Coagulation Profile: Recent Labs  Lab 08/30/17 2255  INR 0.99   Cardiac Enzymes: Recent Labs  Lab 08/30/17 2343  CKTOTAL 213  TROPONINI <0.03   BNP (last 3 results) No results for input(s): PROBNP in the last 8760 hours. HbA1C: No results for input(s):  HGBA1C in the last 72 hours. CBG: Recent Labs  Lab 08/30/17 2234  GLUCAP 95   Lipid Profile: No results for input(s): CHOL, HDL, LDLCALC, TRIG, CHOLHDL, LDLDIRECT in the last 72 hours. Thyroid Function Tests: No results for input(s): TSH, T4TOTAL, FREET4, T3FREE, THYROIDAB in the last 72 hours. Anemia Panel: No results for input(s): VITAMINB12, FOLATE, FERRITIN, TIBC, IRON, RETICCTPCT in the last 72 hours. Urine analysis:    Component Value Date/Time   COLORURINE STRAW (A) 08/30/2017 2333   APPEARANCEUR CLEAR 08/30/2017 2333   LABSPEC 1.002 (L) 08/30/2017 2333   PHURINE 6.0 08/30/2017 2333   GLUCOSEU NEGATIVE 08/30/2017 2333   HGBUR NEGATIVE 08/30/2017 2333   BILIRUBINUR NEGATIVE 08/30/2017 2333   KETONESUR NEGATIVE 08/30/2017 2333   PROTEINUR NEGATIVE 08/30/2017 2333   UROBILINOGEN 1.0 01/08/2015 0030   NITRITE NEGATIVE 08/30/2017 2333   LEUKOCYTESUR NEGATIVE  08/30/2017 2333   Sepsis Labs: @LABRCNTIP (procalcitonin:4,lacticidven:4) )No results found for this or any previous visit (from the past 240 hour(s)).   Radiological Exams on Admission: Ct Head Wo Contrast  Result Date: 08/30/2017 CLINICAL DATA:  Headache altered mental status EXAM: CT HEAD WITHOUT CONTRAST TECHNIQUE: Contiguous axial images were obtained from the base of the skull through the vertex without intravenous contrast. COMPARISON:  CT 02/16/2016 FINDINGS: Brain: No evidence of acute infarction, hemorrhage, hydrocephalus, extra-axial collection or mass lesion/mass effect. Vascular: No hyperdense vessel or unexpected calcification. Skull: Normal. Negative for fracture or focal lesion. Sinuses/Orbits: No acute finding. Other: None IMPRESSION: Negative non contrasted CT appearance of the brain. Electronically Signed   By: Jasmine PangKim  Fujinaga M.D.   On: 08/30/2017 23:23   Dg Chest Port 1 View  Result Date: 08/31/2017 CLINICAL DATA:  Headache.  Left-sided sensory deficits. EXAM: PORTABLE CHEST 1 VIEW COMPARISON:  01/15/2015  FINDINGS: The heart size and mediastinal contours are within normal limits. Both lungs are clear. The visualized skeletal structures are unremarkable. IMPRESSION: No active disease. Electronically Signed   By: Burman NievesWilliam  Stevens M.D.   On: 08/31/2017 00:03    EKG: Independently reviewed. Sinus rhythm, non-specific IVCD.   Assessment/Plan   1. Acute encephalopathy with headache and generalized weakness  - Presents with confusion, lethargy, generalized weakness, and headache with transient vision disturbance  - ED workup includes normal head CT, ammonia, CK, and tox screen  - She reports improvement with IVF hydration and migraine cocktail, but remains generally weak and somnolent  - Check MRI brain, TSH, b12, folate, and RPR; continue neuro checks, consult PT, continue supportive care   2. Depression with anxiety  - Difficult to assess given the clinical situation  - Hold antidepressant and Vistaril until condition improves    3. Alcohol abuse in remission  - Commended on her abstinence and encouraged to continue    4. Elevated transaminases  - AST 107 and ALT 56 on admission, normal on most recent prior CMP  - Non-tender RUQ  - Possibly related to hx of alcohol abuse though reports 2 yrs of abstinence; chronic hepatitis also considered   - Check viral hepatitis panel and TSH, repeat CMP in am    DVT prophylaxis: Lovenox Code Status: Full  Family Communication: Significant other updated at bedside Consults called: None  Admission status: Observation     Briscoe Deutscherimothy S Opyd, MD [facility] Pager [phone]  If 7PM-7AM, please contact night-coverage [email or web address] Password [email or web address]  08/31/2017, 3:34 AM

## 2017-08-31 NOTE — Care Management Note (Signed)
Case Management Note  Patient Details  Name: Heather MarbleLora Biancardi MRN: [id number] Date of Birth: 09/27/1960  Subjective/Objective:       Pt in with acute encephalopathy. She is from home with spouse.              Action/Plan: CM consulted for outpatient therapy. [facility] Orders in Epic and information on the AVS. No f/u per OT and no DME needs.   CM following for further d/c needs.   Expected Discharge Date:                  Expected Discharge Plan:  OP Rehab  In-House Referral:     Discharge planning Services  CM Consult  Post Acute Care Choice:    Choice offered to:     DME Arranged:    DME Agency:     HH Arranged:    HH Agency:     Status of Service:  Completed, signed off  If discussed at [employer] Length [employer] Stay Meetings, dates discussed:    Additional Comments:  Kermit BaloKelli F Trica Usery, RN 08/31/2017, 12:24 PM

## 2017-08-31 NOTE — Discharge Summary (Signed)
Physician Discharge Summary  Heather Hess ZOX:[id number] DOB: 1961/02/24 DOA: 08/30/2017  PCP: Teena Irani, PA-C  Admit date: 08/30/2017 Discharge date: 08/31/2017  Admitted From: Home  Disposition:  Home   Recommendations for Outpatient Follow-up:  1. Follow up with PCP in 1 week 2. Please obtain repeat LFTs in 3-4 weeks 3. Please follow up on the following pending results:  Hepatitis serologies, ceruloplasmin, RBC folate, RPR, HIV  Home Health: None  Equipment/Devices: None  Discharge Condition: Good  CODE STATUS: FULL Diet recommendation: Regular  Brief/Interim Summary: Heather Hess is a 57 y.o. F with hx alcohol use d/o in remission, depression who presents with severe headache, confusion, vomiting.  Patient was in usual state of health on day of admission, worked all day as a Equities trader, and on the drive home gradually developed severe right-sided "railroad spike in the head" headache.  When she got home she appeared flushed, somewhat confused, photophobic, and vomited, so her Heather Hess brought her to the ER.       Discharge Diagnoses:   Headache with confusion, likely complex migraine Denies history of frequent headches, but chart review shows and Heather Hess confirms she has intermittent severe headaches with nausea, and in fact had such a severe headache in 2017 that it brought her to the ER, at that time was found to have complex migraine.    Here, CT head normal.  MRI brain normal, no stroke.  Discussed MRI informally with Neurology, high sensitivity at this time window to rule out [facility] as well. No fever or leukocytosis to suggest meningitis. Ethanol and UDS negative. Patient given IV fluids and therapeutic/diagnostic dose of IV phenergan with complete resolution of headache.      Altered mental status Urine drug screen positive for THC only, ethanol negative.  Urinalysis clear.  MRI normal.  Ammonia normal. -Follow-up RPR, TSH, B12 as an  outpatient  Transaminitis Unclear cause.  Claims to be abstinent from alcohol.  US abdomen normal. -Follow-up hepatitis serologies, ceruloplasmin as an outpatient  Depression Stable on home regimen.     Discharge Instructions  Discharge Instructions    Ambulatory referral to Physical Therapy   Complete by:  As directed    Diet general   Complete by:  As directed    Discharge instructions   Complete by:  As directed    From Dr. Maryfrances Bunnell: You were admitted with headache, confusion and visual changes.   Your MRI brain showed no signs of stroke and no signs of intracranial bleeding.  Instead, we believe your symptoms were related to a "complex migraine", which is one in which there are not only symptoms of headache and nausea like a typical migraine, but also symptoms like confusion or vision changes or slurred speech like a stroke.  You were treated with phenergan, which relieved the headache. Rest from work for the next several days. Follow up with Dr. Montez Morita on Tuesday or Wednesday of next week. If you have more migraines, start with Aleve, but if that doesn't help, coming to the ER for treatment in the ER with Phenergan is reasonable.  To avoid the ER, discuss with Dr. Montez Morita treatments like Imitrex or sumatriptan.  ALSO:  We noticed that your liver function tests were slightly abnormal.  You should follow up with your primary care doctor Dr. Montez Morita early next week.   Ask her to recheck your liver function tests, and to follow up on hepatitis testing here.   Increase activity slowly   Complete by:  As  directed      Allergies as of 08/31/2017      Reactions   Nsaids Hives   Patient takes Mobic at home      Medication List    TAKE these medications   DULoxetine 60 MG capsule Commonly known as:  CYMBALTA Take 1 capsule (60 mg total) by mouth 2 (two) times daily.   hydrOXYzine 25 MG tablet Commonly known as:  ATARAX/VISTARIL Take 1 tablet (25 mg total) by mouth every  6 (six) hours as needed for anxiety.   meloxicam 15 MG tablet Commonly known as:  MOBIC Take 15 mg by mouth daily as needed for pain.   traMADol 50 MG tablet Commonly known as:  ULTRAM Take 50 mg by mouth every 6 (six) hours as needed for moderate pain.   traZODone 50 MG tablet Commonly known as:  DESYREL Take 1 tablet (50 mg total) by mouth at bedtime as needed for sleep (may repeat times one if not effective in one hour).   vitamin k 100 MCG tablet Take 100 mcg by mouth daily.      Follow-up Information    [facility] Follow up.   Specialty:  Rehabilitation Why:  They will contact you for the first appointment. Contact information: [address] [place], [place] [address] [id number] [address] [place] [address] [phone]         Allergies  Allergen Reactions  . Nsaids Hives    Patient takes Mobic at home    Consultations:  None, informally Neurology by phone who were not asked to personally evaluate patient   Procedures/Studies: Ct Head Wo Contrast  Result Date: 08/30/2017 CLINICAL DATA:  Headache altered mental status EXAM: CT HEAD WITHOUT CONTRAST TECHNIQUE: Contiguous axial images were obtained from the base of the skull through the vertex without intravenous contrast. COMPARISON:  CT 02/16/2016 FINDINGS: Brain: No evidence of acute infarction, hemorrhage, hydrocephalus, extra-axial collection or mass lesion/mass effect. Vascular: No hyperdense vessel or unexpected calcification. Skull: Normal. Negative for fracture or focal lesion. Sinuses/Orbits: No acute finding. Other: None IMPRESSION: Negative non contrasted CT appearance of the brain. Electronically Signed   By: Jasmine Pang M.D.   On: 08/30/2017 23:23   Mr Brain Wo Contrast  Result Date: 08/31/2017 CLINICAL DATA:  Confusion and weakness.  Somnolence. EXAM: MRI HEAD WITHOUT CONTRAST TECHNIQUE: Multiplanar, multiecho pulse sequences of the brain and surrounding  structures were obtained without intravenous contrast. COMPARISON:  Head CT 08/30/2017 FINDINGS: Brain: Brain has normal appearance without evidence of malformation, atrophy, old or acute small or large vessel infarction, mass lesion, hemorrhage, hydrocephalus or extra-axial collection. Vascular: Major vessels at the base of the brain show flow. Venous sinuses appear patent. Skull and upper cervical spine: Normal. Sinuses/Orbits: Clear/normal. Other: None significant. IMPRESSION: Normal examination. Electronically Signed   By: Paulina Fusi M.D.   On: 08/31/2017 07:32   US Abdomen Complete  Result Date: 08/31/2017 CLINICAL DATA:  Elevated LFTs EXAM: ABDOMEN ULTRASOUND COMPLETE COMPARISON:  01/15/2015 FINDINGS: Gallbladder: Surgically removed Common bile duct: Diameter: 4.4 mm Liver: No focal lesion identified. Within normal limits in parenchymal echogenicity. Portal vein is patent on color Doppler imaging with normal direction of blood flow towards the liver. IVC: No abnormality visualized. Pancreas: Visualized portion unremarkable. Spleen: Size and appearance within normal limits. Right Kidney: Length: 11.6 cm. Echogenicity within normal limits. No mass or hydronephrosis visualized. Left Kidney: Length: 10.8 cm. Echogenicity within normal limits. No mass or hydronephrosis visualized. Abdominal aorta: No aneurysm visualized. Other findings: None.  IMPRESSION: Status post cholecystectomy. No acute abnormality noted. Electronically Signed   By: Alcide CleverMark  Lukens M.D.   On: 08/31/2017 08:52   Dg Chest Port 1 View  Result Date: 08/31/2017 CLINICAL DATA:  Headache.  Left-sided sensory deficits. EXAM: PORTABLE CHEST 1 VIEW COMPARISON:  01/15/2015 FINDINGS: The heart size and mediastinal contours are within normal limits. Both lungs are clear. The visualized skeletal structures are unremarkable. IMPRESSION: No active disease. Electronically Signed   By: Burman NievesWilliam  Stevens M.D.   On: 08/31/2017 00:03       Subjective: Mild headache remaining this mroning before phenergan, reports it is resolved after phenergan.  Nausea resolved.  Discharge Exam: Vitals:   08/31/17 1020 08/31/17 1149  BP: 118/76 135/75  Pulse: 64 (!) 54  Resp:  18  Temp: 98.7 F (37.1 C) 97.7 F (36.5 C)  SpO2: 99% 98%   Vitals:   08/31/17 0815 08/31/17 0914 08/31/17 1020 08/31/17 1149  BP: 120/80 130/78 118/76 135/75  Pulse: (!) 58 (!) 53 64 (!) 54  Resp:  18  18  Temp: 98.2 F (36.8 C) 97.8 F (36.6 C) 98.7 F (37.1 C) 97.7 F (36.5 C)  TempSrc: Oral Oral Oral Oral  SpO2: 100% 98% 99% 98%  Weight:      Height:        General: Pt is easily arousable, awake, not in acute distress Cardiovascular: RRR, S1/S2 +, no rubs, no gallops Respiratory: CTA bilaterally, no wheezing, no rhonchi Abdominal: Soft, NT, ND, bowel sounds + Extremities: no edema, no cyanosis Neuro: Cranial nerves symmetric.  Speech fluent.  FTN normal, symmetric.  Strength 5/5 and symmetric in upper and lower extremities.      The results of significant diagnostics from this hospitalization (including imaging, microbiology, ancillary and laboratory) are listed below for reference.     Microbiology: No results found for this or any previous visit (from the past 240 hour(s)).   Labs: BNP (last 3 results) No results for input(s): BNP in the last 8760 hours. Basic Metabolic Panel: Recent Labs  Lab 08/30/17 2255 08/31/17 0348  NA 138 141  K 3.8 3.9  CL 102 111  CO2 29 27  GLUCOSE 98 90  BUN 13 11  CREATININE 0.76 0.66  CALCIUM 9.0 8.1*   Liver Function Tests: Recent Labs  Lab 08/30/17 2255 08/31/17 0348  AST 107* 318*  ALT 56* 198*  ALKPHOS 56 63  BILITOT 0.8 0.7  PROT 6.5 5.4*  ALBUMIN 4.3 3.5   No results for input(s): LIPASE, AMYLASE in the last 168 hours. Recent Labs  Lab 08/31/17 0000  AMMONIA 33   CBC: Recent Labs  Lab 08/30/17 2255  WBC 8.0  NEUTROABS 4.6  HGB 14.1  HCT 43.8  MCV 96.1   PLT 174   Cardiac Enzymes: Recent Labs  Lab 08/30/17 2343  CKTOTAL 213  TROPONINI <0.03   BNP: Invalid input(s): POCBNP CBG: Recent Labs  Lab 08/30/17 2234  GLUCAP 95   D-Dimer No results for input(s): DDIMER in the last 72 hours. Hgb A1c No results for input(s): HGBA1C in the last 72 hours. Lipid Profile No results for input(s): CHOL, HDL, LDLCALC, TRIG, CHOLHDL, LDLDIRECT in the last 72 hours. Thyroid function studies Recent Labs    08/31/17 0348  TSH 2.847   Anemia work up Recent Labs    08/31/17 0348  VITAMINB12 1,164*   Urinalysis    Component Value Date/Time   COLORURINE STRAW (A) 08/30/2017 2333   APPEARANCEUR CLEAR 08/30/2017 2333  LABSPEC 1.002 (L) 08/30/2017 2333   PHURINE 6.0 08/30/2017 2333   GLUCOSEU NEGATIVE 08/30/2017 2333   HGBUR NEGATIVE 08/30/2017 2333   BILIRUBINUR NEGATIVE 08/30/2017 2333   KETONESUR NEGATIVE 08/30/2017 2333   PROTEINUR NEGATIVE 08/30/2017 2333   UROBILINOGEN 1.0 01/08/2015 0030   NITRITE NEGATIVE 08/30/2017 2333   LEUKOCYTESUR NEGATIVE 08/30/2017 2333   Sepsis Labs Invalid input(s): PROCALCITONIN,  WBC,  LACTICIDVEN Microbiology No results found for this or any previous visit (from the past 240 hour(s)).   Time coordinating discharge: 45 minutes       SIGNED:   Alberteen Sam, MD  [facility] 08/31/2017, 3:04 PM

## 2017-09-01 LAB — HEPATITIS PANEL, ACUTE
HCV Ab: <0.1 s/co ratio (ref 0.0–0.9)
HEP B S AG: NEGATIVE
Hep A IgM: NEGATIVE
Hep B C IgM: NEGATIVE

## 2017-09-01 LAB — FOLATE RBC
FOLATE, HEMOLYSATE: 518.1 ng/mL
FOLATE, RBC: 1451 ng/mL (ref >498)
HEMATOCRIT: 35.7 % (ref 34.0–46.6)

## 2017-09-01 LAB — HIV ANTIBODY (ROUTINE TESTING W REFLEX): HIV SCREEN 4TH GENERATION: NONREACTIVE

## 2017-09-01 LAB — CERULOPLASMIN: CERULOPLASMIN: 22.8 mg/dL (ref 19.0–39.0)

## 2017-09-02 LAB — RPR: RPR Ser Ql: NONREACTIVE

## 2019-08-30 ENCOUNTER — Other Ambulatory Visit: Payer: Self-pay

## 2019-08-30 ENCOUNTER — Emergency Department (HOSPITAL_BASED_OUTPATIENT_CLINIC_OR_DEPARTMENT_OTHER)
Admission: EM | Admit: 2019-08-30 | Discharge: 2019-08-30 | Disposition: A | Discharge status: Home / Self Care | Payer: BC Managed Care – PPO | Attending: Emergency Medicine | Admitting: Emergency Medicine

## 2019-08-30 ENCOUNTER — Encounter (HOSPITAL_BASED_OUTPATIENT_CLINIC_OR_DEPARTMENT_OTHER): Payer: Self-pay | Admitting: *Deleted

## 2019-08-30 ENCOUNTER — Emergency Department (HOSPITAL_BASED_OUTPATIENT_CLINIC_OR_DEPARTMENT_OTHER): Payer: BC Managed Care – PPO

## 2019-08-30 DIAGNOSIS — N12 Tubulo-interstitial nephritis, not specified as acute or chronic: Secondary | ICD-10-CM | POA: Diagnosis not present

## 2019-08-30 DIAGNOSIS — M791 Myalgia, unspecified site: Secondary | ICD-10-CM | POA: Insufficient documentation

## 2019-08-30 DIAGNOSIS — Z87891 Personal history of nicotine dependence: Secondary | ICD-10-CM | POA: Diagnosis not present

## 2019-08-30 DIAGNOSIS — R1031 Right lower quadrant pain: Secondary | ICD-10-CM | POA: Diagnosis present

## 2019-08-30 LAB — COMPREHENSIVE METABOLIC PANEL
ALT: 35 U/L (ref 0–44)
AST: 62 U/L — ABNORMAL HIGH (ref 15–41)
Albumin: 3.9 g/dL (ref 3.5–5.0)
Alkaline Phosphatase: 68 U/L (ref 38–126)
Anion gap: 10 (ref 5–15)
BUN: 9 mg/dL (ref 6–20)
CO2: 29 mmol/L (ref 22–32)
Calcium: 8.5 mg/dL — ABNORMAL LOW (ref 8.9–10.3)
Chloride: 100 mmol/L (ref 98–111)
Creatinine, Ser: 0.76 mg/dL (ref 0.44–1.00)
GFR calc Af Amer: >60 mL/min (ref >60)
GFR calc non Af Amer: >60 mL/min (ref >60)
Glucose, Bld: 100 mg/dL — ABNORMAL HIGH (ref 70–99)
Potassium: 3.5 mmol/L (ref 3.5–5.1)
Sodium: 139 mmol/L (ref 135–145)
Total Bilirubin: 0.7 mg/dL (ref 0.3–1.2)
Total Protein: 6.6 g/dL (ref 6.5–8.1)

## 2019-08-30 LAB — URINALYSIS, ROUTINE W REFLEX MICROSCOPIC
Bilirubin Urine: NEGATIVE
Glucose, UA: NEGATIVE mg/dL
Hgb urine dipstick: NEGATIVE
Ketones, ur: NEGATIVE mg/dL
Nitrite: POSITIVE — AB
Protein, ur: NEGATIVE mg/dL
Specific Gravity, Urine: 1.01 (ref 1.005–1.030)
pH: 6 (ref 5.0–8.0)

## 2019-08-30 LAB — LIPASE, BLOOD: Lipase: 25 U/L (ref 11–51)

## 2019-08-30 LAB — URINALYSIS, MICROSCOPIC (REFLEX): WBC, UA: >50 WBC/hpf (ref 0–5)

## 2019-08-30 LAB — CBC WITH DIFFERENTIAL/PLATELET
Abs Immature Granulocytes: 0.02 10*3/uL (ref 0.00–0.07)
Basophils Absolute: 0 10*3/uL (ref 0.0–0.1)
Basophils Relative: 0 %
Eosinophils Absolute: 0 10*3/uL (ref 0.0–0.5)
Eosinophils Relative: 0 %
HCT: 39.2 % (ref 36.0–46.0)
Hemoglobin: 12.9 g/dL (ref 12.0–15.0)
Immature Granulocytes: 0 %
Lymphocytes Relative: 22 %
Lymphs Abs: 1.6 10*3/uL (ref 0.7–4.0)
MCH: 31 pg (ref 26.0–34.0)
MCHC: 32.9 g/dL (ref 30.0–36.0)
MCV: 94.2 fL (ref 80.0–100.0)
Monocytes Absolute: 0.5 10*3/uL (ref 0.1–1.0)
Monocytes Relative: 7 %
Neutro Abs: 5.2 10*3/uL (ref 1.7–7.7)
Neutrophils Relative %: 71 %
Platelets: 169 10*3/uL (ref 150–400)
RBC: 4.16 MIL/uL (ref 3.87–5.11)
RDW: 12.2 % (ref 11.5–15.5)
WBC: 7.4 10*3/uL (ref 4.0–10.5)
nRBC: 0 % (ref 0.0–0.2)

## 2019-08-30 MED ORDER — CEPHALEXIN 500 MG PO CAPS: 500.0000 mg | ORAL_CAPSULE | Freq: Three times a day (TID) | ORAL | 0 refills | Status: DC

## 2019-08-30 MED ORDER — SODIUM CHLORIDE 0.9 % IV SOLN
1.0000 g | Freq: Once | INTRAVENOUS | Status: AC
Start: 2019-08-30 — End: 2019-08-30
  Administered 2019-08-30: 1 g via INTRAVENOUS
  Filled 2019-08-30: qty 10

## 2019-08-30 MED ORDER — IOHEXOL 300 MG/ML  SOLN
100.0000 mL | Freq: Once | INTRAMUSCULAR | Status: AC | PRN
  Administered 2019-08-30: 100 mL via INTRAVENOUS

## 2019-08-30 MED ORDER — SODIUM CHLORIDE 0.9 % IV BOLUS
1000.0000 mL | Freq: Once | INTRAVENOUS | Status: AC
Start: 2019-08-30 — End: 2019-08-30
  Administered 2019-08-30: 1000 mL via INTRAVENOUS

## 2019-08-30 NOTE — ED Provider Notes (Signed)
[facility] EMERGENCY DEPARTMENT Provider Note   CSN: [id number] Arrival date & time: 08/30/19  1821     History Chief Complaint  Patient presents with  . Abdominal Pain    Heather Hess is a 59 y.o. female.  Patient with history of alcohol use, history of hysterectomy and cholecystectomy --presents to the emergency department for abdominal pain.  Patient reports developing pain around her bellybutton over the past 24 hours and is has now moved to the right lateral abdomen.  Prior to this, patient has felt generally fatigued and tired.  She has had some generalized muscle aches and chills.  She has had a fever to 100.2 F at home.  She treated this with ibuprofen.  She denies chest pain, cough, shortness of breath.  No hematuria or irritative UTI symptoms including dysuria, increased frequency or urgency.         Past Medical History:  Diagnosis Date  . Acute kidney injury (HCC)   . Alcohol abuse   . Anxiety   . Bipolar 1 disorder (HCC)   . Chronic back pain   . Chronic thoracic back pain   . Depression   . Herniated cervical disc   . Opiate abuse, episodic (HCC)   . Septicemia [facility](HCC)     Patient Active Problem List   Diagnosis Date Noted  . Acute encephalopathy 08/31/2017  . Headache 08/31/2017  . Generalized weakness 08/31/2017  . Alcohol abuse, in remission 01/28/2016  . Depression with anxiety 01/08/2015    Past Surgical History:  Procedure Laterality Date  . ABDOMINAL HYSTERECTOMY    . HEMORRHOID SURGERY    . LAPAROSCOPIC CHOLECYSTECTOMY    . TONSILLECTOMY    . TUBAL LIGATION       OB History   No obstetric history on file.     History reviewed. No pertinent family history.  Social History   Tobacco Use  . Smoking status: Former Smoker    Packs/day: 0.50    Years: 30.00    Pack years: 15.00    Types: Cigarettes    Quit date: 05/02/2015    Years since quitting: 4.3  . Smokeless tobacco: Never Used  Vaping Use  . Vaping Use: Every  day  Substance Use Topics  . Alcohol use: Yes    Comment: 08/31/2017 "h/o alcohol abuse; haven't had anything in ~ 2 yrs"  . Drug use: No    Home Medications Prior to Admission medications   Medication Sig Start Date End Date Taking? Authorizing Provider  DULoxetine (CYMBALTA) 60 MG capsule Take 1 capsule (60 mg total) by mouth 2 (two) times daily. 02/01/16  Yes Adonis BrookAgustin, Sheila, NP  cephALEXin (KEFLEX) 500 MG capsule Take 1 capsule (500 mg total) by mouth 3 (three) times daily. 08/30/19   Renne CriglerGeiple, Franceen Erisman, PA-C  hydrOXYzine (ATARAX/VISTARIL) 25 MG tablet Take 1 tablet (25 mg total) by mouth every 6 (six) hours as needed for anxiety. 02/01/16   Adonis BrookAgustin, Sheila, NP  meloxicam (MOBIC) 15 MG tablet Take 15 mg by mouth daily as needed for pain.    [provider]  traMADol (ULTRAM) 50 MG tablet Take 50 mg by mouth every 6 (six) hours as needed for moderate pain.    [provider]  traZODone (DESYREL) 50 MG tablet Take 1 tablet (50 mg total) by mouth at bedtime as needed for sleep (may repeat times one if not effective in one hour). Patient not taking: Reported on 08/31/2017 02/01/16   Adonis BrookAgustin, Sheila, NP  vitamin k  100 MCG tablet Take 100 mcg by mouth daily.    [provider]    Allergies    Nsaids  Review of Systems   Review of Systems  Constitutional: Positive for appetite change, chills, fatigue and fever.  HENT: Negative for rhinorrhea and sore throat.   Eyes: Negative for redness.  Respiratory: Negative for cough.   Cardiovascular: Negative for chest pain.  Gastrointestinal: Positive for abdominal pain and nausea. Negative for diarrhea and vomiting.  Genitourinary: Negative for dysuria, frequency, hematuria and urgency.  Musculoskeletal: Positive for myalgias.  Skin: Negative for rash.  Neurological: Negative for headaches.    Physical Exam Updated Vital Signs BP 108/73   Pulse 90   Temp 99.2 F (37.3 C) (Oral)   Resp 18   SpO2 100%   Physical  Exam Vitals and nursing note reviewed.  Constitutional:      Appearance: She is well-developed.  HENT:     Head: Normocephalic and atraumatic.  Eyes:     General:        Right eye: No discharge.        Left eye: No discharge.     Conjunctiva/sclera: Conjunctivae normal.  Cardiovascular:     Rate and Rhythm: Normal rate and regular rhythm.     Heart sounds: Normal heart sounds.  Pulmonary:     Effort: Pulmonary effort is normal.     Breath sounds: Normal breath sounds.  Abdominal:     Palpations: Abdomen is soft.     Tenderness: There is abdominal tenderness in the right lower quadrant and periumbilical area. There is no guarding or rebound. Negative signs include Murphy's sign, Rovsing's sign and McBurney's sign.  Musculoskeletal:     Cervical back: Normal range of motion and neck supple.  Skin:    General: Skin is warm and dry.  Neurological:     Mental Status: She is alert.     ED Results / Procedures / Treatments   Labs (all labs ordered are listed, but only abnormal results are displayed) Labs Reviewed  URINALYSIS, ROUTINE W REFLEX MICROSCOPIC - Abnormal; Notable for the following components:      Result Value   APPearance CLOUDY (*)    Nitrite POSITIVE (*)    Leukocytes,Ua LARGE (*)    All other components within normal limits  COMPREHENSIVE METABOLIC PANEL - Abnormal; Notable for the following components:   Glucose, Bld 100 (*)    Calcium 8.5 (*)    AST 62 (*)    All other components within normal limits  URINALYSIS, MICROSCOPIC (REFLEX) - Abnormal; Notable for the following components:   Bacteria, UA MANY (*)    All other components within normal limits  URINE CULTURE  CBC WITH DIFFERENTIAL/PLATELET  LIPASE, BLOOD    EKG None  Radiology CT ABDOMEN PELVIS W CONTRAST  Result Date: 08/30/2019 CLINICAL DATA:  Right lower quadrant abdominal pain EXAM: CT ABDOMEN AND PELVIS WITH CONTRAST TECHNIQUE: Multidetector CT imaging of the abdomen and pelvis was  performed using the standard protocol following bolus administration of intravenous contrast. CONTRAST:  OMNIPAQUE IOHEXOL 300 MG/ML  SOLN COMPARISON:  Ultrasound 08/31/2017, 10/14/2015, CT 01/08/2015 FINDINGS: Lower chest: Lung bases are clear. Normal heart size. No pericardial effusion. Hepatobiliary: No focal liver abnormality is seen. Patient is post cholecystectomy. Slight prominence of the biliary tree likely related to reservoir effect. No calcified intraductal gallstones. Pancreas: Unremarkable. No pancreatic ductal dilatation or surrounding inflammatory changes. Spleen: Normal in size without focal abnormality. Adrenals/Urinary Tract: Normal adrenal  glands. Kidneys enhance and excrete symmetrically. 0.4 cm fluid attenuation cyst in the posterior right kidney. More wedge like areas of intermediate attenuation in the right upper pole (coronal 5/50) and lower pole (5/40), less convincing for cyst and raising the possibility of a striated nephrogram or possible infarct. Numerous additional subcentimeter hypertension foci throughout both kidneys too small to fully characterize on CT imaging but statistically likely benign. Bilateral extrarenal pelves. No urolithiasis or hydronephrosis. Circumferential bladder wall thickening is present. Stomach/Bowel: Distal esophagus, stomach and duodenal sweep are unremarkable. No small bowel wall thickening or dilatation. No evidence of obstruction. Cecum displaced into the pelvis with a retrocecal appendix coursing posterosuperior from the cecal tip. No periappendiceal stranding or inflammation. No proximal colonic dilatation or wall thickening. Scattered colonic diverticula without focal inflammation to suggest diverticulitis. Vascular/Lymphatic: Atherosclerotic calcifications throughout the abdominal aorta and branch vessels. No aneurysm or ectasia. No enlarged abdominopelvic lymph nodes. Reproductive: Uterus is surgically absent. No concerning adnexal lesions. No  concerning adnexal lesions. Other: No abdominopelvic free fluid or free gas. No bowel containing hernias. Small fat containing umbilical hernia. Musculoskeletal: Multilevel degenerative changes are present in the imaged portions of the spine. Stable vertebral body hemangioma at L2 and T11. No acute osseous abnormality or suspicious osseous lesion. IMPRESSION: 1. Circumferential bladder wall thickening, suggestive of cystitis. Correlate with urinalysis. 2. More wedge like areas of intermediate attenuation in the right upper pole and lower pole less convincing for cyst and raising the possibility of a striated nephrogram which could be seen possible infarct or ascending tract infectio. Correlate with the urinalysis 3. Normal appendix. 4. Colonic diverticulosis without evidence of diverticulitis. 5. Aortic Atherosclerosis (ICD10-I70.0). Electronically Signed   By: Kreg Shropshire M.D.   On: 08/30/2019 20:21    Procedures Procedures (including critical care time)  Medications Ordered in ED Medications  sodium chloride 0.9 % bolus 1,000 mL (1,000 mLs Intravenous New Bag/Given 08/30/19 2014)  cefTRIAXone (ROCEPHIN) 1 g in sodium chloride 0.9 % 100 mL IVPB (1 g Intravenous New Bag/Given 08/30/19 2015)  iohexol (OMNIPAQUE) 300 MG/ML solution 100 mL (100 mLs Intravenous Contrast Given 08/30/19 1956)    ED Course  I have reviewed the triage vital signs and the nursing notes.  Pertinent labs & imaging results that were available during my care of the patient were reviewed by me and considered in my medical decision making (see chart for details).  Patient seen and examined. Work-up initiated.  Discussed with patient, given history of periumbilical pain migrating to the right side becoming locally tender, CT advised to evaluate for possibility of appendicitis.  Patient is in agreement.  Vital signs reviewed and are as follows: BP 108/73   Pulse 90   Temp 99.2 F (37.3 C) (Oral)   Resp 18   SpO2 100%   UA  demonstrates urinary infection.  Culture sent.  Rocephin ordered.  Patient pending CT.  CT reviewed by myself and I reviewed radiology results.  Patient with signs and symptoms consistent with cystitis and possible pyelonephritis.  Possibility of renal infarct.  Patient with normal kidney function.  We discussed all results.  We discussed that at this point, pyelonephritis and cystitis is most likely given her symptoms and UA findings.  Discussed need for close follow-up.  If her urine infection improves with treatment and she still continues to have pain, she will need further evaluation for renal infarct and evaluation for this.  At this point I do not feel that she needs to be admitted for  further evaluation, given UA findings suggestive of infection.  Patient is in agreement.  She seems reassured.  We will start on cephalexin which patient will pick up tomorrow.  The patient was urged to return to the Emergency Department immediately with worsening of current symptoms, worsening abdominal pain, persistent vomiting, blood noted in stools, fever, or any other concerns. The patient verbalized understanding.     MDM Rules/Calculators/A&P                          Patient with right sided abdominal pain, low-grade fever at home.  Lab work-up is reassuring.  UA suggestive of UTI.  Culture sent.  CT ordered to evaluate for appendicitis or other cause.  This is suggestive of cystitis.  Also suggestive of pyelonephritis versus renal infarct.  At this point, given the patient's other findings, I feel that this most likely represents pyelonephritis.  I did discuss with the patient that if her UTI is improved and she continues to have pain, she will need to be evaluated and worked up for possible renal infarct.  She verbalized understanding.  Return instructions as above.  Patient seems reliable.     Final Clinical Impression(s) / ED Diagnoses Final diagnoses:  Pyelonephritis    Rx / DC Orders ED  Discharge Orders         Ordered    cephALEXin (KEFLEX) 500 MG capsule  3 times daily     Discontinue  Reprint     08/30/19 2049           Renne Crigler, PA-C 08/30/19 4982    Jacalyn Lefevre, MD 08/30/19 2132

## 2019-08-30 NOTE — Discharge Instructions (Signed)
Please read and follow all provided instructions.  Your diagnoses today include:  1. Pyelonephritis     Tests performed today include:  Blood counts and electrolytes  Blood tests to check liver and kidney function  Blood tests to check pancreas function  Urine test to look for infection - shows obvious signs of urine infection  CT of your abdomen and pelvis -shows signs of a bladder infection and kidney infection, cannot entirely rule out a blood clot to the kidney  Vital signs. See below for your results today.   Medications prescribed:   Keflex (cephalexin) - antibiotic  You have been prescribed an antibiotic medicine: take the entire course of medicine even if you are feeling better. Stopping early can cause the antibiotic not to work.  Take any prescribed medications only as directed.  Home care instructions:   Follow any educational materials contained in this packet.  Follow-up instructions: Please follow-up with your primary care provider in the next 3 days for further evaluation of your symptoms.    As we discussed, your symptoms are most likely being caused by a bladder infection that is moving to the right kidney.  You do have findings of potential renal infarct or blood clot in the kidney.  If your urine infection is improved and you still have pain, your doctor may need to look into this possibility further.     Return instructions:  SEEK IMMEDIATE MEDICAL ATTENTION IF:  The pain does not go away or becomes severe   A temperature above 101F develops   Repeated vomiting occurs (multiple episodes)   The pain becomes localized to portions of the abdomen. The right side could possibly be appendicitis. In an adult, the left lower portion of the abdomen could be colitis or diverticulitis.   Blood is being passed in stools or vomit (bright red or black tarry stools)   You develop chest pain, difficulty breathing, dizziness or fainting, or become confused, poorly  responsive, or inconsolable (young children)  If you have any other emergent concerns regarding your health  Additional Information: Abdominal (belly) pain can be caused by many things. Your caregiver performed an examination and possibly ordered blood/urine tests and imaging (CT scan, x-rays, ultrasound). Many cases can be observed and treated at home after initial evaluation in the emergency department. Even though you are being discharged home, abdominal pain can be unpredictable. Therefore, you need a repeated exam if your pain does not resolve, returns, or worsens. Most patients with abdominal pain don't have to be admitted to the hospital or have surgery, but serious problems like appendicitis and gallbladder attacks can start out as nonspecific pain. Many abdominal conditions cannot be diagnosed in one visit, so follow-up evaluations are very important.  Your vital signs today were: BP 108/73    Pulse 90    Temp 99.2 F (37.3 C) (Oral)    Resp 18    SpO2 100%  If your blood pressure (bp) was elevated above 135/85 this visit, please have this repeated by your doctor within one month. --------------

## 2019-08-30 NOTE — ED Triage Notes (Signed)
C/o right lower abd pain x 1 day , n/d.

## 2019-09-02 LAB — URINE CULTURE: Culture: >=100000 — AB

## 2019-09-03 NOTE — Progress Notes (Signed)
ED Antimicrobial Stewardship Positive Culture Follow Up   Heather Hess is an 59 y.o. female who presented to [facility] on 08/30/2019 with a chief complaint of  Chief Complaint  Patient presents with  . Abdominal Pain    Recent Results (from the past 720 hour(s))  Urine culture     Status: Abnormal   Collection Time: 08/30/19  6:30 PM   Specimen: Urine, Clean Catch  Result Value Ref Range Status   Specimen Description   Final    URINE, CLEAN CATCH Performed at [facility], [address] [place], [place] [address]    Special Requests   Final    NONE Performed at [facility], [address] [place], [place] [address]    Culture (A)  Final    >=100,000 COLONIES/mL ESCHERICHIA COLI Confirmed Extended Spectrum Beta-Lactamase Producer (ESBL).  In bloodstream infections from ESBL organisms, carbapenems are preferred over piperacillin/tazobactam. They are shown to have a lower risk of mortality.    Report Status 09/02/2019 FINAL  Final   Organism ID, Bacteria ESCHERICHIA COLI (A)  Final      Susceptibility   Escherichia coli - MIC*    AMPICILLIN >=32 RESISTANT Resistant     CEFAZOLIN >=64 RESISTANT Resistant     CEFTRIAXONE >=64 RESISTANT Resistant     CIPROFLOXACIN 0.5 SENSITIVE Sensitive     GENTAMICIN <=1 SENSITIVE Sensitive     IMIPENEM <=0.25 SENSITIVE Sensitive     NITROFURANTOIN <=16 SENSITIVE Sensitive     TRIMETH/SULFA <=20 SENSITIVE Sensitive     AMPICILLIN/SULBACTAM >=32 RESISTANT Resistant     PIP/TAZO <=4 SENSITIVE Sensitive     * >=100,000 COLONIES/mL ESCHERICHIA COLI    [x]  Treated with cephalexin, organism resistant to prescribed antimicrobial []  Patient discharged originally without antimicrobial agent and treatment is now indicated  New antibiotic prescription: DC cephalexin, start ciprofloxacin 500mg  PO BID x 5 days   ED Provider: , PA-C   Kresta Templeman, 09/03/2019, 9:29 AM Clinical Pharmacist Monday -  Friday phone -  [phone] Saturday - Sunday phone - [phone]

## 2020-09-02 ENCOUNTER — Other Ambulatory Visit: Payer: Self-pay

## 2020-09-02 ENCOUNTER — Emergency Department (HOSPITAL_BASED_OUTPATIENT_CLINIC_OR_DEPARTMENT_OTHER): Payer: BC Managed Care – PPO

## 2020-09-02 ENCOUNTER — Encounter (HOSPITAL_BASED_OUTPATIENT_CLINIC_OR_DEPARTMENT_OTHER): Payer: Self-pay | Admitting: Emergency Medicine

## 2020-09-02 ENCOUNTER — Emergency Department (HOSPITAL_BASED_OUTPATIENT_CLINIC_OR_DEPARTMENT_OTHER)
Admission: EM | Admit: 2020-09-02 | Discharge: 2020-09-02 | Disposition: A | Discharge status: Home / Self Care | Payer: BC Managed Care – PPO | Attending: Emergency Medicine | Admitting: Emergency Medicine

## 2020-09-02 ENCOUNTER — Encounter: Payer: Self-pay | Admitting: Emergency Medicine

## 2020-09-02 DIAGNOSIS — W51XXXA Accidental striking against or bumped into by another person, initial encounter: Secondary | ICD-10-CM | POA: Insufficient documentation

## 2020-09-02 DIAGNOSIS — M546 Pain in thoracic spine: Secondary | ICD-10-CM | POA: Insufficient documentation

## 2020-09-02 DIAGNOSIS — Z87891 Personal history of nicotine dependence: Secondary | ICD-10-CM | POA: Diagnosis not present

## 2020-09-02 DIAGNOSIS — G8929 Other chronic pain: Secondary | ICD-10-CM | POA: Insufficient documentation

## 2020-09-02 DIAGNOSIS — R0602 Shortness of breath: Secondary | ICD-10-CM | POA: Diagnosis not present

## 2020-09-02 DIAGNOSIS — S2241XA Multiple fractures of ribs, right side, initial encounter for closed fracture: Secondary | ICD-10-CM | POA: Insufficient documentation

## 2020-09-02 DIAGNOSIS — S299XXA Unspecified injury of thorax, initial encounter: Secondary | ICD-10-CM | POA: Diagnosis present

## 2020-09-02 MED ORDER — OXYCODONE HCL 5 MG PO TABS: 5.0000 mg | ORAL_TABLET | ORAL | 0 refills | Status: AC | PRN

## 2020-09-02 MED ORDER — OXYCODONE-ACETAMINOPHEN 5-325 MG PO TABS
1.0000 | ORAL_TABLET | Freq: Once | ORAL | Status: AC
  Administered 2020-09-02: 1 via ORAL
  Filled 2020-09-02: qty 1

## 2020-09-02 MED ORDER — CYCLOBENZAPRINE HCL 10 MG PO TABS
10.0000 mg | ORAL_TABLET | Freq: Once | ORAL | Status: AC
  Administered 2020-09-02: 10 mg via ORAL
  Filled 2020-09-02: qty 1

## 2020-09-02 MED ORDER — KETOROLAC TROMETHAMINE 60 MG/2ML IM SOLN
30.0000 mg | Freq: Once | INTRAMUSCULAR | Status: AC
  Administered 2020-09-02: 30 mg via INTRAMUSCULAR
  Filled 2020-09-02: qty 2

## 2020-09-02 NOTE — ED Triage Notes (Signed)
Pt reports her [place] Pt reports right sided rib pain and "clicking" when she takes a deep breath.

## 2020-09-02 NOTE — ED Notes (Signed)
ED Provider at bedside. 
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
## 2020-09-02 NOTE — Discharge Instructions (Addendum)
Recommend taking Tylenol and Motrin as needed for pain control.  Additionally take the muscle relaxer as prescribed.  For breakthrough pain take the prescribed oxycodone.  Note this can make you drowsy and she will be taken while driving or operating machinery.  Continue using incentive spirometer as discussed.  Follow-up with your primary doctor for recheck within the next few days.
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
## 2021-11-07 ENCOUNTER — Encounter (HOSPITAL_BASED_OUTPATIENT_CLINIC_OR_DEPARTMENT_OTHER): Payer: Self-pay | Admitting: Emergency Medicine

## 2021-11-07 ENCOUNTER — Emergency Department (HOSPITAL_BASED_OUTPATIENT_CLINIC_OR_DEPARTMENT_OTHER): Payer: No Typology Code available for payment source

## 2021-11-07 ENCOUNTER — Encounter (HOSPITAL_COMMUNITY): Payer: Self-pay

## 2021-11-07 ENCOUNTER — Inpatient Hospital Stay (HOSPITAL_BASED_OUTPATIENT_CLINIC_OR_DEPARTMENT_OTHER)
Admission: EM | Admit: 2021-11-07 | Discharge: 2021-11-10 | DRG: 605 | Disposition: A | Discharge status: Home / Self Care | Payer: No Typology Code available for payment source | Attending: Family Medicine | Admitting: Family Medicine

## 2021-11-07 ENCOUNTER — Other Ambulatory Visit: Payer: Self-pay

## 2021-11-07 DIAGNOSIS — W5501XA Bitten by cat, initial encounter: Secondary | ICD-10-CM | POA: Diagnosis not present

## 2021-11-07 DIAGNOSIS — M546 Pain in thoracic spine: Secondary | ICD-10-CM | POA: Diagnosis present

## 2021-11-07 DIAGNOSIS — Z886 Allergy status to analgesic agent status: Secondary | ICD-10-CM | POA: Diagnosis not present

## 2021-11-07 DIAGNOSIS — Y929 Unspecified place or not applicable: Secondary | ICD-10-CM

## 2021-11-07 DIAGNOSIS — I959 Hypotension, unspecified: Secondary | ICD-10-CM | POA: Diagnosis present

## 2021-11-07 DIAGNOSIS — F411 Generalized anxiety disorder: Secondary | ICD-10-CM | POA: Diagnosis present

## 2021-11-07 DIAGNOSIS — G8929 Other chronic pain: Secondary | ICD-10-CM | POA: Diagnosis present

## 2021-11-07 DIAGNOSIS — F319 Bipolar disorder, unspecified: Secondary | ICD-10-CM | POA: Diagnosis present

## 2021-11-07 DIAGNOSIS — Z87891 Personal history of nicotine dependence: Secondary | ICD-10-CM | POA: Diagnosis not present

## 2021-11-07 DIAGNOSIS — L03113 Cellulitis of right upper limb: Secondary | ICD-10-CM | POA: Diagnosis present

## 2021-11-07 DIAGNOSIS — Z79899 Other long term (current) drug therapy: Secondary | ICD-10-CM | POA: Diagnosis not present

## 2021-11-07 DIAGNOSIS — S61451A Open bite of right hand, initial encounter: Principal | ICD-10-CM | POA: Diagnosis present

## 2021-11-07 LAB — CBC WITH DIFFERENTIAL/PLATELET
Abs Immature Granulocytes: 0.02 10*3/uL (ref 0.00–0.07)
Basophils Absolute: 0 10*3/uL (ref 0.0–0.1)
Basophils Relative: 0 %
Eosinophils Absolute: 0 10*3/uL (ref 0.0–0.5)
Eosinophils Relative: 0 %
HCT: 38.8 % (ref 36.0–46.0)
Hemoglobin: 12.9 g/dL (ref 12.0–15.0)
Immature Granulocytes: 0 %
Lymphocytes Relative: 27 %
Lymphs Abs: 2 10*3/uL (ref 0.7–4.0)
MCH: 30.9 pg (ref 26.0–34.0)
MCHC: 33.2 g/dL (ref 30.0–36.0)
MCV: 92.8 fL (ref 80.0–100.0)
Monocytes Absolute: 0.5 10*3/uL (ref 0.1–1.0)
Monocytes Relative: 7 %
Neutro Abs: 4.9 10*3/uL (ref 1.7–7.7)
Neutrophils Relative %: 66 %
Platelets: 172 10*3/uL (ref 150–400)
RBC: 4.18 MIL/uL (ref 3.87–5.11)
RDW: 12.7 % (ref 11.5–15.5)
WBC: 7.5 10*3/uL (ref 4.0–10.5)
nRBC: 0 % (ref 0.0–0.2)

## 2021-11-07 LAB — BASIC METABOLIC PANEL
Anion gap: 11 (ref 5–15)
BUN: 8 mg/dL (ref 6–20)
CO2: 28 mmol/L (ref 22–32)
Calcium: 8.7 mg/dL — ABNORMAL LOW (ref 8.9–10.3)
Chloride: 100 mmol/L (ref 98–111)
Creatinine, Ser: 0.68 mg/dL (ref 0.44–1.00)
GFR, Estimated: >60 mL/min (ref >60)
Glucose, Bld: 88 mg/dL (ref 70–99)
Potassium: 3.6 mmol/L (ref 3.5–5.1)
Sodium: 139 mmol/L (ref 135–145)

## 2021-11-07 MED ORDER — MORPHINE SULFATE (PF) 4 MG/ML IV SOLN
4.0000 mg | Freq: Once | INTRAVENOUS | Status: AC
  Administered 2021-11-07: 4 mg via INTRAVENOUS
  Filled 2021-11-07: qty 1

## 2021-11-07 MED ORDER — VANCOMYCIN HCL 750 MG/150ML IV SOLN
750.0000 mg | Freq: Two times a day (BID) | INTRAVENOUS | Status: DC
  Administered 2021-11-08 – 2021-11-10 (×5): 750 mg via INTRAVENOUS
  Filled 2021-11-07 (×6): qty 150

## 2021-11-07 MED ORDER — ONDANSETRON HCL 4 MG/2ML IJ SOLN
4.0000 mg | Freq: Once | INTRAMUSCULAR | Status: AC
  Administered 2021-11-07: 4 mg via INTRAVENOUS
  Filled 2021-11-07: qty 2

## 2021-11-07 MED ORDER — SODIUM CHLORIDE 0.9 % IV BOLUS
1000.0000 mL | Freq: Once | INTRAVENOUS | Status: AC
  Administered 2021-11-07: 1000 mL via INTRAVENOUS

## 2021-11-07 MED ORDER — VANCOMYCIN HCL IN DEXTROSE 1-5 GM/200ML-% IV SOLN
1000.0000 mg | Freq: Once | INTRAVENOUS | Status: AC
  Administered 2021-11-07: 1000 mg via INTRAVENOUS
  Filled 2021-11-07: qty 200

## 2021-11-07 MED ORDER — SODIUM CHLORIDE 0.9 % IV SOLN
3.0000 g | Freq: Four times a day (QID) | INTRAVENOUS | Status: DC
  Administered 2021-11-07 – 2021-11-10 (×11): 3 g via INTRAVENOUS
  Filled 2021-11-07 (×11): qty 8

## 2021-11-07 NOTE — ED Provider Notes (Signed)
[facility] EMERGENCY DEPARTMENT Provider Note  CSN: [id number] Arrival date & time: 11/07/21 1640  Chief Complaint(s) Animal Bite  HPI Heather Hess is a 61 y.o. female with history of bipolar disorder presenting to the emergency department with pain to the right hand.  Patient was bit by her cat on Thursday.  Went to urgent care and was prescribed antibiotics.  Has taken antibiotics over the past few days but swelling has worsened and pain has increased.  No fevers or chills, nausea or vomiting.  No abdominal pain.  Works as a Engineer, production.   Past Medical History Past Medical History:  Diagnosis Date   Acute kidney injury (HCC)    Alcohol abuse    Anxiety    Bipolar 1 disorder (HCC)    Chronic back pain    Chronic thoracic back pain    Depression    Herniated cervical disc    Opiate abuse, episodic (HCC)    Septicemia [facility])    Patient Active Problem List   Diagnosis Date Noted   Cat bite 11/07/2021   Chronic thoracic back pain 09/02/2020   Acute encephalopathy 08/31/2017   Headache 08/31/2017   Generalized weakness 08/31/2017   Alcohol abuse, in remission 01/28/2016   Depression with anxiety 01/08/2015   Home Medication(s) Prior to Admission medications   Medication Sig Start Date End Date Taking? Authorizing Provider  cephALEXin (KEFLEX) 500 MG capsule Take 1 capsule (500 mg total) by mouth 3 (three) times daily. 08/30/19   Renne Crigler, PA-C  DULoxetine (CYMBALTA) 60 MG capsule Take 1 capsule (60 mg total) by mouth 2 (two) times daily. 02/01/16   Adonis Brook, NP  hydrOXYzine (ATARAX/VISTARIL) 25 MG tablet Take 1 tablet (25 mg total) by mouth every 6 (six) hours as needed for anxiety. 02/01/16   Adonis Brook, NP  meloxicam (MOBIC) 15 MG tablet Take 15 mg by mouth daily as needed for pain.    [provider]  traMADol (ULTRAM) 50 MG tablet Take 50 mg by mouth every 6 (six) hours as needed for moderate pain.    [provider]  traZODone  (DESYREL) 50 MG tablet Take 1 tablet (50 mg total) by mouth at bedtime as needed for sleep (may repeat times one if not effective in one hour). Patient not taking: Reported on 08/31/2017 02/01/16   Adonis Brook, NP  vitamin k 100 MCG tablet Take 100 mcg by mouth daily.    [provider]                                                                                                                                    Past Surgical History Past Surgical History:  Procedure Laterality Date   ABDOMINAL HYSTERECTOMY     HEMORRHOID SURGERY     LAPAROSCOPIC CHOLECYSTECTOMY     TONSILLECTOMY     TUBAL LIGATION     Family History History reviewed. No pertinent family  history.  Social History Social History   Tobacco Use   Smoking status: Former    Packs/day: 0.50    Years: 30.00    Total pack years: 15.00    Types: Cigarettes    Quit date: 05/02/2015    Years since quitting: 6.5   Smokeless tobacco: Never  Vaping Use   Vaping Use: Every day  Substance Use Topics   Alcohol use: Yes    Comment: 08/31/2017 "h/o alcohol abuse; haven't had anything in ~ 2 yrs"   Drug use: No   Allergies Nsaids  Review of Systems Review of Systems  All other systems reviewed and are negative.   Physical Exam Vital Signs  I have reviewed the triage vital signs BP 132/70   Pulse 64   Temp 98.2 F (36.8 C) (Oral)   Resp 18   Ht 5\' 8"  (1.727 m)   Wt 63.5 kg   SpO2 99%   BMI 21.29 kg/m  Physical Exam Vitals and nursing note reviewed.  Constitutional:      General: She is not in acute distress.    Appearance: She is well-developed.  HENT:     Head: Normocephalic and atraumatic.     Mouth/Throat:     Mouth: Mucous membranes are moist.  Eyes:     Pupils: Pupils are equal, round, and reactive to light.  Cardiovascular:     Rate and Rhythm: Normal rate and regular rhythm.     Heart sounds: No murmur heard. Pulmonary:     Effort: Pulmonary effort is normal. No respiratory distress.      Breath sounds: Normal breath sounds.  Abdominal:     General: Abdomen is flat.     Palpations: Abdomen is soft.     Tenderness: There is no abdominal tenderness.  Musculoskeletal:        General: No tenderness.     Right lower leg: No edema.     Left lower leg: No edema.     Comments: Significant soft tissue edema to the posterior right hand, no focal fluctuance to suggest abscess, significant warmth and erythema as well as significant tenderness.  Full range of motion of the fingers of the right hand, able to make a fist.  No tenderness over flexor sheath or swelling to the digits  Skin:    General: Skin is warm and dry.  Neurological:     General: No focal deficit present.     Mental Status: She is alert. Mental status is at baseline.  Psychiatric:        Mood and Affect: Mood normal.        Behavior: Behavior normal.     ED Results and Treatments Labs (all labs ordered are listed, but only abnormal results are displayed) Labs Reviewed  BASIC METABOLIC PANEL - Abnormal; Notable for the following components:      Result Value   Calcium 8.7 (*)    All other components within normal limits  CBC WITH DIFFERENTIAL/PLATELET  Radiology DG Hand Complete Right  Result Date: 11/07/2021 CLINICAL DATA:  Right hand animal bite EXAM: RIGHT HAND - COMPLETE 3+ VIEW COMPARISON:  None Available. FINDINGS: Soft tissue swelling along the dorsum of the wrist. No acute bony abnormality. Specifically, no fracture, subluxation, or dislocation. No radiopaque foreign body or soft tissue gas. IMPRESSION: No fracture or foreign body. Electronically Signed   By: Charlett Nose M.D.   On: 11/07/2021 17:30    Pertinent labs & imaging results that were available during my care of the patient were reviewed by me and considered in my medical decision making (see MDM for  details).  Medications Ordered in ED Medications  Ampicillin-Sulbactam (UNASYN) 3 g in sodium chloride 0.9 % 100 mL IVPB (0 g Intravenous Stopped 11/07/21 2017)  vancomycin (VANCOCIN) IVPB 1000 mg/200 mL premix (has no administration in time range)  morphine (PF) 4 MG/ML injection 4 mg (4 mg Intravenous Given 11/07/21 1939)  ondansetron (ZOFRAN) injection 4 mg (4 mg Intravenous Given 11/07/21 1936)  sodium chloride 0.9 % bolus 1,000 mL (1,000 mLs Intravenous New Bag/Given 11/07/21 1946)                                                                                                                                     Procedures Procedures  (including critical care time)  Medical Decision Making / ED Course   MDM:  61 year old female presenting to the emergency department with hand swelling.  Patient was bit by a cat, examination consistent with cat bite wound infection, no sign of flexor tenosynovitis with no finger swelling, tenderness over flexor sheath.  Able to range joints, doubt septic joint.  Will check basic labs.  Given outpatient treatment failure, patient will likely need admission for IV antibiotics.  Clinical Course as of 11/07/21 2024  Wynelle Link Nov 07, 2021  2022 Dr. Margo Aye has accepted the patient for admission at [facility].  [WS]    Clinical Course User Index [WS] Lonell Grandchild, MD     Additional history obtained: -Additional history obtained from family -External records from outside source obtained and reviewed including: Chart review including previous notes, labs, imaging, consultation notes   Lab Tests: -I ordered, reviewed, and interpreted labs.   The pertinent results include:   Labs Reviewed  BASIC METABOLIC PANEL - Abnormal; Notable for the following components:      Result Value   Calcium 8.7 (*)    All other components within normal limits  CBC WITH DIFFERENTIAL/PLATELET      EKG   EKG Interpretation  Date/Time:    Ventricular Rate:    PR  Interval:    QRS Duration:   QT Interval:    QTC Calculation:   R Axis:     Text Interpretation:           Imaging Studies ordered: I ordered imaging studies including XR hand On my interpretation imaging demonstrates no acute  problem I independently visualized and interpreted imaging. I agree with the radiologist interpretation   Medicines ordered and prescription drug management: Meds ordered this encounter  Medications   morphine (PF) 4 MG/ML injection 4 mg   ondansetron (ZOFRAN) injection 4 mg   sodium chloride 0.9 % bolus 1,000 mL   Ampicillin-Sulbactam (UNASYN) 3 g in sodium chloride 0.9 % 100 mL IVPB    Order Specific Question:   Antibiotic Indication:    Answer:   Wound Infection   vancomycin (VANCOCIN) IVPB 1000 mg/200 mL premix    Order Specific Question:   Indication:    Answer:   Cellulitis    -I have reviewed the patients home medicines and have made adjustments as needed   Consultations Obtained: I requested consultation with the hospitalist,  and discussed lab and imaging findings as well as pertinent plan - they recommend: admission   Cardiac Monitoring: The patient was maintained on a cardiac monitor.  I personally viewed and interpreted the cardiac monitored which showed an underlying rhythm of: NSR  Social Determinants of Health:  Factors impacting patients care include: former smoker   Reevaluation: After the interventions noted above, I reevaluated the patient and found that they have improved  Co morbidities that complicate the patient evaluation  Past Medical History:  Diagnosis Date   Acute kidney injury (HCC)    Alcohol abuse    Anxiety    Bipolar 1 disorder (HCC)    Chronic back pain    Chronic thoracic back pain    Depression    Herniated cervical disc    Opiate abuse, episodic (HCC)    Septicemia (HCC)       Dispostion: Admit    Final Clinical Impression(s) / ED Diagnoses Final diagnoses:  Cat bite, initial encounter   Cellulitis of right hand     This chart was dictated using voice recognition software.  Despite best efforts to proofread,  errors can occur which can change the documentation meaning.    Lonell Grandchild, MD 11/07/21 2024

## 2021-11-07 NOTE — Progress Notes (Signed)
Pharmacy Antibiotic Note  Heather Hess is a 61 y.o. female for which pharmacy has been consulted for vancomycin dosing for cellulitis.  Patient with a history of bipolar disorder, AKI, EtOH abuse, anxiety, opiate abuse, septicemia . Patient presenting with pain to rt hand. Bit by cat on Thursday and started on augmentin.  SCr 0.68 WBC 7.5; T afeb; HR 65; RR 18  Plan: Unasyn per MD Vancomycin 1000 mg given at [facility] ED --Will start 750 mg q12hr (eAUC 486.8) unless change in renal function Trend WBC, Fever, Renal function F/u cultures, clinical course, WBC, fever De-escalate when able Levels at steady state  Height: 5\' 8"  (172.7 cm) Weight: 63.5 kg (140 lb) IBW/kg (Calculated) : 63.9  Temp (24hrs), Avg:98.2 F (36.8 C), Min:98.2 F (36.8 C), Max:98.2 F (36.8 C)  No results for input(s): "WBC", "CREATININE", "LATICACIDVEN", "VANCOTROUGH", "VANCOPEAK", "VANCORANDOM", "GENTTROUGH", "GENTPEAK", "GENTRANDOM", "TOBRATROUGH", "TOBRAPEAK", "TOBRARND", "AMIKACINPEAK", "AMIKACINTROU", "AMIKACIN" in the last 168 hours.  CrCl cannot be calculated (Patient's most recent lab result is older than the maximum 21 days allowed.).    Allergies  Allergen Reactions   Nsaids Hives    Patient takes Mobic at home    Antimicrobials this admission: vancomycin 9/10 >>  unasyn 9/10 >>   Microbiology results: Pending  Thank you for allowing pharmacy to be a part of this patient's care.  11/10, PharmD, BCPS 11/07/2021 7:16 PM ED Clinical Pharmacist -  [phone]

## 2021-11-07 NOTE — ED Notes (Signed)
[facility] on unit to transport pt  

## 2021-11-07 NOTE — ED Triage Notes (Signed)
Cat bite to right hand that occurred Thursday. Seen at [facility] and prescribed abx. Has been on abx x 2 days. Swelling, redness and pain has worsened. Told to come to ED for further eval.

## 2021-11-08 ENCOUNTER — Encounter (HOSPITAL_COMMUNITY): Payer: Self-pay | Admitting: Internal Medicine

## 2021-11-08 DIAGNOSIS — F411 Generalized anxiety disorder: Secondary | ICD-10-CM

## 2021-11-08 DIAGNOSIS — L03113 Cellulitis of right upper limb: Secondary | ICD-10-CM

## 2021-11-08 DIAGNOSIS — W5501XA Bitten by cat, initial encounter: Secondary | ICD-10-CM

## 2021-11-08 LAB — CBC WITH DIFFERENTIAL/PLATELET
Abs Immature Granulocytes: 0.01 10*3/uL (ref 0.00–0.07)
Basophils Absolute: 0 10*3/uL (ref 0.0–0.1)
Basophils Relative: 0 %
Eosinophils Absolute: 0.1 10*3/uL (ref 0.0–0.5)
Eosinophils Relative: 1 %
HCT: 32.8 % — ABNORMAL LOW (ref 36.0–46.0)
Hemoglobin: 11.2 g/dL — ABNORMAL LOW (ref 12.0–15.0)
Immature Granulocytes: 0 %
Lymphocytes Relative: 29 %
Lymphs Abs: 2.2 10*3/uL (ref 0.7–4.0)
MCH: 31.4 pg (ref 26.0–34.0)
MCHC: 34.1 g/dL (ref 30.0–36.0)
MCV: 91.9 fL (ref 80.0–100.0)
Monocytes Absolute: 0.7 10*3/uL (ref 0.1–1.0)
Monocytes Relative: 10 %
Neutro Abs: 4.4 10*3/uL (ref 1.7–7.7)
Neutrophils Relative %: 60 %
Platelets: 146 10*3/uL — ABNORMAL LOW (ref 150–400)
RBC: 3.57 MIL/uL — ABNORMAL LOW (ref 3.87–5.11)
RDW: 12.5 % (ref 11.5–15.5)
WBC: 7.4 10*3/uL (ref 4.0–10.5)
nRBC: 0 % (ref 0.0–0.2)

## 2021-11-08 LAB — COMPREHENSIVE METABOLIC PANEL
ALT: 13 U/L (ref 0–44)
AST: 16 U/L (ref 15–41)
Albumin: 2.9 g/dL — ABNORMAL LOW (ref 3.5–5.0)
Alkaline Phosphatase: 43 U/L (ref 38–126)
Anion gap: 7 (ref 5–15)
BUN: 6 mg/dL (ref 6–20)
CO2: 26 mmol/L (ref 22–32)
Calcium: 7.8 mg/dL — ABNORMAL LOW (ref 8.9–10.3)
Chloride: 105 mmol/L (ref 98–111)
Creatinine, Ser: 0.74 mg/dL (ref 0.44–1.00)
GFR, Estimated: >60 mL/min (ref >60)
Glucose, Bld: 117 mg/dL — ABNORMAL HIGH (ref 70–99)
Potassium: 3.4 mmol/L — ABNORMAL LOW (ref 3.5–5.1)
Sodium: 138 mmol/L (ref 135–145)
Total Bilirubin: 0.3 mg/dL (ref 0.3–1.2)
Total Protein: 5.2 g/dL — ABNORMAL LOW (ref 6.5–8.1)

## 2021-11-08 LAB — MAGNESIUM: Magnesium: 1.7 mg/dL (ref 1.7–2.4)

## 2021-11-08 MED ORDER — DULOXETINE HCL 60 MG PO CPEP
60.0000 mg | ORAL_CAPSULE | Freq: Two times a day (BID) | ORAL | Status: DC
  Administered 2021-11-08 – 2021-11-10 (×5): 60 mg via ORAL
  Filled 2021-11-08 (×5): qty 1

## 2021-11-08 MED ORDER — LACTATED RINGERS IV SOLN: INTRAVENOUS | Status: AC

## 2021-11-08 MED ORDER — LACTATED RINGERS IV BOLUS
1000.0000 mL | Freq: Once | INTRAVENOUS | Status: AC
  Administered 2021-11-08: 1000 mL via INTRAVENOUS

## 2021-11-08 MED ORDER — MORPHINE SULFATE (PF) 4 MG/ML IV SOLN
4.0000 mg | INTRAVENOUS | Status: DC | PRN
  Administered 2021-11-08 – 2021-11-10 (×10): 4 mg via INTRAVENOUS
  Filled 2021-11-08 (×10): qty 1

## 2021-11-08 MED ORDER — NALOXONE HCL 0.4 MG/ML IJ SOLN: 0.4000 mg | INTRAMUSCULAR | Status: DC | PRN

## 2021-11-08 MED ORDER — ACETAMINOPHEN 650 MG RE SUPP: 650.0000 mg | Freq: Four times a day (QID) | RECTAL | Status: DC | PRN

## 2021-11-08 MED ORDER — ACETAMINOPHEN 325 MG PO TABS
650.0000 mg | ORAL_TABLET | Freq: Four times a day (QID) | ORAL | Status: DC | PRN
  Administered 2021-11-08: 650 mg via ORAL
  Filled 2021-11-08: qty 2

## 2021-11-08 MED ORDER — KETOROLAC TROMETHAMINE 15 MG/ML IJ SOLN
15.0000 mg | Freq: Four times a day (QID) | INTRAMUSCULAR | Status: DC | PRN
  Administered 2021-11-08 – 2021-11-09 (×3): 15 mg via INTRAVENOUS
  Filled 2021-11-08 (×3): qty 1

## 2021-11-08 MED ORDER — ONDANSETRON HCL 4 MG/2ML IJ SOLN: 4.0000 mg | Freq: Four times a day (QID) | INTRAMUSCULAR | Status: DC | PRN

## 2021-11-08 NOTE — H&P (Signed)
History and Physical    PLEASE NOTE THAT DRAGON DICTATION SOFTWARE WAS USED IN THE CONSTRUCTION OF THIS NOTE.   Heather Hess KZS:[id number] DOB: 14-Jul-1960 DOA: 11/07/2021  PCP: Aura Dials, PA-C  Patient coming from: home   I have personally briefly reviewed patient's old medical records in [place]  Chief Complaint: right hand pain  HPI: Heather Hess is a 61 y.o. female with medical history significant for GAD, bipolar disorder, who is admitted to [facility] on 11/07/2021 by way of transfer from [place] emergency department with cellulitis of the right hand after presenting from home to the latter facility complaining of right hand pain.   The patient conveys was at a on dorsal surface of her right hand on Thursday, 11/04/2021.  By the next day, she had development erythema associated with the dorsal surface of the right hand associated with increased warmth, tenderness, swelling.  On Friday, 823, she presented to a local urgent care with the above complaints, at which time she was diagnosed with an prescribed Augmentin.  Started first dose of Augmentin on the evening of Friday, 11/05/2021, took 2 doses on Saturday, and most recent dose on the morning of Sunday, 11/07/2021.  However, in spite of good compliance in the interval since initial prescription, the patient reports further progression in right hand erythema, tenderness, swelling, prompting her to present to [place] emergency department for further evaluation and management thereof.  Denies any extension of swelling into the fingers on the right hand, and conveys preservation of range of motion involving the right wrist, including that of extension and flexion, as well as full range of motion in all 5 fingers on right hand then maintains ability to make fist.  Denies any associated numbness or paresthesias.  She also denies rash in any other location.  Has not noted any drainage from the  right hand, including no evidence of purulence.  Denies any associated subjective fever, chills, rigors, or generalized myalgias.  No recent chest pain, shortness of breath, palpitations, diaphoresis, dizziness, presyncope, or syncope.  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  No known history of underlying diabetes.     [facility] ED Course:  Vital signs in the ED were notable for the following: Afebrile; heart rate 64-80; blood pressure 132/66; respiratory rate 16-18, oxygen saturation 98 to 100% on room air.  Labs were notable for the following: CMP notable for sodium 139,  bicarbonate 28, creatinine 0.68.  CBC notable for white blood cell count 7500 with 66% neutrophils, hemoglobin 12.9.  Imaging and additional notable ED work-up: Plain films of the right hand showed evidence of soft tissue swelling along the dorsum of the right hand, consistent with cellulitis in the absence of any evidence of subcutaneous gas to suggest necrotizing fasciitis, also showing no evidence of acute bony abnormality, including no evidence of fracture, dislocation, or subluxation.  No evidence of associated foreign body.  While in the ED, the following were administered: IV vancomycin and Unasyn.   Subsequently, the patient was admitted for further evaluation and management of cellulitis of the right hand in the setting of concern for failure of outpatient oral antibiotics.     Review of Systems: As per HPI otherwise 10 point review of systems negative.   Past Medical History:  Diagnosis Date   Acute kidney injury ([place])    Alcohol abuse    Anxiety  Bipolar 1 disorder (HCC)    Chronic back pain    Chronic thoracic back pain    Depression    Herniated cervical disc    Opiate abuse, episodic (HCC)    Septicemia ([place])     Past Surgical History:  Procedure Laterality Date   ABDOMINAL HYSTERECTOMY     HEMORRHOID SURGERY     LAPAROSCOPIC CHOLECYSTECTOMY      TONSILLECTOMY     TUBAL LIGATION      Social History:  reports that she quit smoking about 6 years ago. Her smoking use included cigarettes. She has a 15.00 pack-year smoking history. She has never used smokeless tobacco. She reports current alcohol use. She reports that she does not use drugs.   Allergies  Allergen Reactions   Nsaids Hives    Patient takes Mobic at home    History reviewed. No pertinent family history.    Prior to Admission medications   Medication Sig Start Date End Date Taking? Authorizing Provider  cephALEXin (KEFLEX) 500 MG capsule Take 1 capsule (500 mg total) by mouth 3 (three) times daily. 08/30/19   Carlisle Cater, PA-C  DULoxetine (CYMBALTA) 60 MG capsule Take 1 capsule (60 mg total) by mouth 2 (two) times daily. 02/01/16   Kerrie Buffalo, NP  hydrOXYzine (ATARAX/VISTARIL) 25 MG tablet Take 1 tablet (25 mg total) by mouth every 6 (six) hours as needed for anxiety. 02/01/16   Kerrie Buffalo, NP  meloxicam (MOBIC) 15 MG tablet Take 15 mg by mouth daily as needed for pain.    [provider]  traMADol (ULTRAM) 50 MG tablet Take 50 mg by mouth every 6 (six) hours as needed for moderate pain.    [provider]  traZODone (DESYREL) 50 MG tablet Take 1 tablet (50 mg total) by mouth at bedtime as needed for sleep (may repeat times one if not effective in one hour). Patient not taking: Reported on 08/31/2017 02/01/16   Kerrie Buffalo, NP  vitamin k 100 MCG tablet Take 100 mcg by mouth daily.    [provider]     Objective    Physical Exam: Vitals:   11/07/21 2100 11/07/21 2148 11/08/21 0009 11/08/21 0044  BP: 130/68 137/82 (!) 84/46 (!) 89/57  Pulse: 65 75 70   Resp: 18 16 16    Temp:  98.5 F (36.9 C) 98.3 F (36.8 C)   TempSrc:  Oral Oral   SpO2: 100% 100% 98%   Weight:      Height:        General: appears to be stated age; alert, oriented Skin: warm, dry; erythema over the dorsal surface of the right hand without  crepitus;  Head:  AT/[place] Mouth:  Oral mucosa membranes appear moist, normal dentition Neck: supple; trachea midline Heart:  RRR; did not appreciate any M/R/G Lungs: CTAB, did not appreciate any wheezes, rales, or rhonchi Abdomen: + BS; soft, ND, NT Vascular: 2+ pedal pulses b/l; 2+ radial pulses b/l Extremities: mild, gross swelling involving right hand, without pitting edema; no muscle wasting; erythema over the dorsal surface of the wrist and associated with increased warmth, tenderness; full range of motion of all 5 digits on the right hand noted as well as full range of motion involving flexion/extension of the right wrist.  No tenderness over the flexor sheath. Neuro: strength and sensation intact in upper and lower extremities b/l     Labs on Admission: I have personally reviewed following labs and imaging studies  CBC: Recent Labs  Lab 11/07/21 1919  WBC 7.5  NEUTROABS 4.9  HGB 12.9  HCT 38.8  MCV 92.8  PLT 735   Basic Metabolic Panel: Recent Labs  Lab 11/07/21 1919  NA 139  K 3.6  CL 100  CO2 28  GLUCOSE 88  BUN 8  CREATININE 0.68  CALCIUM 8.7*   GFR: Estimated Creatinine Clearance: 75.9 mL/min (by C-G formula based on SCr of 0.68 mg/dL). Liver Function Tests: No results for input(s): "AST", "ALT", "ALKPHOS", "BILITOT", "PROT", "ALBUMIN" in the last 168 hours. No results for input(s): "LIPASE", "AMYLASE" in the last 168 hours. No results for input(s): "AMMONIA" in the last 168 hours. Coagulation Profile: No results for input(s): "INR", "PROTIME" in the last 168 hours. Cardiac Enzymes: No results for input(s): "CKTOTAL", "CKMB", "CKMBINDEX", "TROPONINI" in the last 168 hours. BNP (last 3 results) No results for input(s): "PROBNP" in the last 8760 hours. HbA1C: No results for input(s): "HGBA1C" in the last 72 hours. CBG: No results for input(s): "GLUCAP" in the last 168 hours. Lipid Profile: No results for input(s): "CHOL", "HDL", "LDLCALC", "TRIG",  "CHOLHDL", "LDLDIRECT" in the last 72 hours. Thyroid Function Tests: No results for input(s): "TSH", "T4TOTAL", "FREET4", "T3FREE", "THYROIDAB" in the last 72 hours. Anemia Panel: No results for input(s): "VITAMINB12", "FOLATE", "FERRITIN", "TIBC", "IRON", "RETICCTPCT" in the last 72 hours. Urine analysis:    Component Value Date/Time   COLORURINE YELLOW 08/30/2019 1829   APPEARANCEUR CLOUDY (A) 08/30/2019 1829   LABSPEC 1.010 08/30/2019 1829   PHURINE 6.0 08/30/2019 1829   GLUCOSEU NEGATIVE 08/30/2019 1829   HGBUR NEGATIVE 08/30/2019 1829   BILIRUBINUR NEGATIVE 08/30/2019 1829   KETONESUR NEGATIVE 08/30/2019 1829   PROTEINUR NEGATIVE 08/30/2019 1829   UROBILINOGEN 1.0 01/08/2015 0030   NITRITE POSITIVE (A) 08/30/2019 1829   LEUKOCYTESUR LARGE (A) 08/30/2019 1829    Radiological Exams on Admission: DG Hand Complete Right  Result Date: 11/07/2021 CLINICAL DATA:  Right hand animal bite EXAM: RIGHT HAND - COMPLETE 3+ VIEW COMPARISON:  None Available. FINDINGS: Soft tissue swelling along the dorsum of the wrist. No acute bony abnormality. Specifically, no fracture, subluxation, or dislocation. No radiopaque foreign body or soft tissue gas. IMPRESSION: No fracture or foreign body. Electronically Signed   By: Rolm Baptise M.D.   On: 11/07/2021 17:30     Assessment/Plan   Principal Problem:   Cat bite Active Problems:   Cellulitis of right upper extremity   GAD (generalized anxiety disorder)     #) Cellulitis of right hand: Following cat bite on 11/04/2020, patient's will progressive erythema, swelling, tenderness, increased warmth over the dorsum of the right hand, with progression of the symptoms in spite of good compliance with Augmentin, having taken a total of 4 doses since prescription of this antibiotic on Friday, 11/05/2021, leading to concern for failure of outpatient oral antibiotics for this patient with cellulitis involving the hand, leading to broadening of antibiotic  spectrum via IV antibiotics, IV vancomycin and Unasyn initiated at [facility] this evening.  Of note, right upper extremity, including right hand appears neurovascularly intact.  Swelling of right hand does not extend into the fingers and she maintains full range of motion of all 5 fingers on the right hand as well as preservation of range of motion in flexion/extension of the right wrist.  Additionally, no tenderness over the flexor sheath.   Of note, plain films of the right hand show evidence of soft tissue swelling involving the dorsum of the right hand consistent with  cellulitis, in the absence of any evidence of subcutaneous gas nor any evidence of acute bony abnormality.  Additionally, no evidence of crepitus on exam to suggest necrotizing fasciitis.  No SIRS criteria met at this time.  Therefore, criteria for sepsis not currently met.  Overall, no evidence of tenosynovitis or abscess involving the right hand/wrist, while remaining neurovascular intact, without any evidence of compromise to range of motion of the 5 digits or flexion/extension of the wrist or any evidence of tenderness over the flexor sheath.  Consequently, there does not appear to be an indication at this time for urgent consultation of hand surgery.   Plan: Continue IV vancomycin and Unasyn.  Repeat CBC with differential in the morning.  Placed nursing communication orders requesting that the current distribution of right hand erythema be line, along with request to elevate right upper extremity to promote drainage.  Prn IV morphine for pain. Prn IV toradol. Continuous LR.         #) Generalized anxiety disorder: Documented history of such, on Cymbalta as an outpatient.  Plan: will continue home Cymbalta.      DVT prophylaxis: SCD's   Code Status: Full code Family Communication: none Disposition Plan: Per Rounding Team Consults called: none;  Admission status: inpatient;     PLEASE NOTE THAT  DRAGON DICTATION SOFTWARE WAS USED IN THE CONSTRUCTION OF THIS NOTE.   [place] DO [facility]  [employer]   11/08/2021, 1:27 AM

## 2021-11-08 NOTE — Progress Notes (Signed)
PROGRESS NOTE  Heather Hess  DOB: 1960-04-26  PCP: Teena Irani, PA-C DUK:[id number]  DOA: 11/07/2021  LOS: 1 day  Hospital Day: 2  Brief narrative: Heather Hess is a 61 y.o. female with PMH significant for bipolar disorder, GAD. Patient presented to the ED on 9/10 with worsening wound at the site of cat bite. 9/7, patient had a cat bite to his right hand.  By the next day, it became red, swollen, warm and tender.  She went to local urgent care.  She was discharged on oral Augmentin which he started the same day.  Despite taking it for about 4 doses, he did not see improvement in his symptoms and hence came to med [facility] ED on 9/10.  In the ED, patient was hemodynamically stable Labs unremarkable including normal WBC Wound did not show any purulent drainage.  Swelling has not extended to the fingers or wrist to impair range of movement. X-ray right hand showed soft tissue swelling along the dorsum of the wrist without any acute bony abnormality or foreign body or soft tissue gas.   Given IV vancomycin and IV Unasyn Admitted to [facility]   Subjective: Patient was seen and examined this AM.  Pleasant middle-aged Caucasian female.  Not in distress.  No new symptoms. Right hand swelling slightly better.  Continues to have tenderness on palpation. Chart reviewed Labs this morning with Potassium low at 3.4  Assessment and plan: Right hand cellulitis following cat bite Cat bite on 9/7 with progressive inflammation that did not respond to 2 days of oral Augmentin Admitted for failure of outpatient antibiotics. IV vancomycin and IV Unasyn given in the ED. Neurovascular status intact.  Range of movement of fingers and wrist not affected. X-ray without any fracture or foreign body or gas. No evidence of tenosynovitis or abscess. Continue IV vancomycin and Unasyn for next 24 hours.  If her wound continues to improve, will plan to discharge her tomorrow on oral antibiotics,  probably combination of Augmentin and doxycycline pain medicines ordered  Hypotension No evidence of sepsis but patient had blood pressure down to 80s last night. Currently on LR at 125 mill per hour.  Generalized anxiety disorder Bipolar disorder Continue Cymbalta   Goals of care   Code Status: Full Code    Mobility: Encourage ambulation  Skin assessment:     Nutritional status:  Body mass index is 21.29 kg/m.          Diet:  Diet Order             Diet regular Room service appropriate? Yes; Fluid consistency: Thin  Diet effective now                   DVT prophylaxis:  SCDs Start: 11/08/21 0052   Antimicrobials: IV Unasyn, IV vancomycin Fluid: LR at 125 mill per hour Consultants: None Family Communication: None at bedside  Status is: Inpatient  Continue in-hospital care because: Needs IV antibiotics for another 24 hours Level of care: Med-Surg   Dispo: The patient is from: Home              Anticipated d/c is to: Home tomorrow hopefully              Patient currently is not medically stable to d/c.   Difficult to place patient No     Infusions:   ampicillin-sulbactam (UNASYN) IV 3 g (11/08/21 3762)   lactated ringers 125 mL/hr at 11/08/21 0119   vancomycin  750 mg (11/08/21 0816)    Scheduled Meds:  DULoxetine  60 mg Oral BID    PRN meds: acetaminophen **OR** acetaminophen, ketorolac, morphine injection, naLOXone (NARCAN)  injection, ondansetron (ZOFRAN) IV   Antimicrobials: Anti-infectives (From admission, onward)    Start     Dose/Rate Route Frequency Ordered Stop   11/08/21 0800  vancomycin (VANCOREADY) IVPB 750 mg/150 mL        750 mg 150 mL/hr over 60 Minutes Intravenous Every 12 hours 11/07/21 2146     11/07/21 1930  Ampicillin-Sulbactam (UNASYN) 3 g in sodium chloride 0.9 % 100 mL IVPB        3 g 200 mL/hr over 30 Minutes Intravenous Every 6 hours 11/07/21 1916     11/07/21 1930  vancomycin (VANCOCIN) IVPB 1000 mg/200 mL  premix        1,000 mg 200 mL/hr over 60 Minutes Intravenous  Once 11/07/21 1916 11/07/21 2134       Objective: Vitals:   11/08/21 0603 11/08/21 0733  BP: (!) 96/57 (!) 94/52  Pulse: 65 64  Resp: 16 16  Temp: 98.4 F (36.9 C) 98.1 F (36.7 C)  SpO2: 94% 97%   No intake or output data in the 24 hours ending 11/08/21 0826 Filed Weights   11/07/21 1714 11/08/21 0500  Weight: 63.5 kg 63.5 kg   Weight change:  Body mass index is 21.29 kg/m.   Physical Exam: General exam: Pleasant, middle-aged Caucasian female.  Not in physical distress Skin: No rashes, lesions or ulcers. HEENT: Atraumatic, normocephalic, no obvious bleeding Lungs: Clear to auscultation bilaterally CVS: Regular rate and rhythm, no murmur GI/Abd soft, nontender, nondistended, bowel sound present CNS: Alert, awake, oriented x3 Psychiatry: Mood appropriate Extremities: No pedal edema, no calf tenderness.  Right hand with swelling, redness, tenderness on the dorsum.  No oozing from the bite site.  Redness and soft tissue swelling present over the wrist as well and probably why it is tender to touch.  Only mild limitation in range of movement of the right wrist.  Data Review: I have personally reviewed the laboratory data and studies available.  F/u labs not needed Unresulted Labs (From admission, onward)    None       Signed, Lorin Glass, MD [facility] 11/08/2021

## 2021-11-08 NOTE — TOC CM/SW Note (Signed)
  Transition of Care [facility]) Screening Note   Patient Details  Name: Heather Hess Date of Birth: 1961-02-17    Transition of Care Department [facility]) has reviewed patient and no TOC needs have been identified at this time. We will continue to monitor patient advancement through interdisciplinary progression rounds. If new patient transition needs arise, please place a TOC consult.

## 2021-11-09 DIAGNOSIS — F319 Bipolar disorder, unspecified: Secondary | ICD-10-CM

## 2021-11-09 LAB — SEDIMENTATION RATE: Sed Rate: 17 mm/hr (ref 0–22)

## 2021-11-09 LAB — C-REACTIVE PROTEIN: CRP: 4.7 mg/dL — ABNORMAL HIGH (ref <1.0)

## 2021-11-09 MED FILL — Morphine Sulfate Inj 4 MG/ML: INTRAMUSCULAR | Qty: 1 | Status: AC

## 2021-11-09 NOTE — Progress Notes (Signed)
Orthopedic Tech Progress Note Patient Details:  Heather Hess 05/08/1960 [id number]  Ortho Devices Type of Ortho Device: Velcro wrist splint Ortho Device/Splint Location: RUE Ortho Device/Splint Interventions: Ordered, Application, Adjustment   Post Interventions Patient Tolerated: Well Instructions Provided: Care of device  Donald Pore 11/09/2021, 1:05 PM

## 2021-11-09 NOTE — Consult Note (Signed)
Reason for Consult:Cat bite Referring Physician: Melina Schools Dahal Time called: 1025 Time at bedside: 1039   Heather Hess is an 61 y.o. female.  HPI: Carman was bitten by her cat on the right hand Thursday night. Friday morning she woke up with some redness and swelling. She went to UC and was placed on Augmentin. She was still symptomatic on Sunday and went to the ED as directed and was admitted. She admits that the swelling, redness, and warmth are all improved. Hospitalist was concerned about unchanging appearance and hand surgery was consulted. 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  Past Medical History:  Diagnosis Date   Acute kidney injury (HCC)    Alcohol abuse    Anxiety    Bipolar 1 disorder (HCC)    Chronic back pain    Chronic thoracic back pain    Depression    Herniated cervical disc    Opiate abuse, episodic (HCC)    Septicemia (HCC)     Past Surgical History:  Procedure Laterality Date   ABDOMINAL HYSTERECTOMY     HEMORRHOID SURGERY     LAPAROSCOPIC CHOLECYSTECTOMY     TONSILLECTOMY     TUBAL LIGATION      History reviewed. No pertinent family history.  Social History:  reports that she quit smoking about 6 years ago. Her smoking use included cigarettes. She has a 15.00 pack-year smoking history. She has never used smokeless tobacco. She reports current alcohol use. She reports that she does not use drugs.  Allergies:  Allergies  Allergen Reactions   Nsaids Hives    (Patient reports that she tolerates ibuprofen and mobic at home without any side-effects). Pt stated she is no longer allergic to Nsaids.      Medications: I have reviewed the patient's current medications.  Results for orders placed or performed during the hospital encounter of 11/07/21 (from the past 48 hour(s))  Basic metabolic panel     Status: Abnormal   Collection Time: 11/07/21  7:19 PM  Result Value Ref Range   Sodium 139 135 - 145 mmol/L   Potassium 3.6 3.5 - 5.1 mmol/L    Chloride 100 98 - 111 mmol/L   CO2 28 22 - 32 mmol/L   Glucose, Bld 88 70 - 99 mg/dL    Comment: Glucose reference range applies only to samples taken after fasting for at least 8 hours.   BUN 8 6 - 20 mg/dL   Creatinine, Ser 1.19 0.44 - 1.00 mg/dL   Calcium 8.7 (L) 8.9 - 10.3 mg/dL   GFR, Estimated >14 >78 mL/min    Comment: (NOTE) Calculated using the CKD-EPI Creatinine Equation (2021)    Anion gap 11 5 - 15    Comment: Performed at [facility], [address] [place], [place] [address]  CBC with Differential     Status: None   Collection Time: 11/07/21  7:19 PM  Result Value Ref Range   WBC 7.5 4.0 - 10.5 K/uL   RBC 4.18 3.87 - 5.11 MIL/uL   Hemoglobin 12.9 12.0 - 15.0 g/dL   HCT 13.0 86.5 - 78.4 %   MCV 92.8 80.0 - 100.0 fL   MCH 30.9 26.0 - 34.0 pg   MCHC 33.2 30.0 - 36.0 g/dL   RDW 69.6 29.5 - 28.4 %   Platelets 172 150 - 400 K/uL   nRBC 0.0 0.0 - 0.2 %   Neutrophils Relative % 66 %   Neutro Abs 4.9 1.7 -  7.7 K/uL   Lymphocytes Relative 27 %   Lymphs Abs 2.0 0.7 - 4.0 K/uL   Monocytes Relative 7 %   Monocytes Absolute 0.5 0.1 - 1.0 K/uL   Eosinophils Relative 0 %   Eosinophils Absolute 0.0 0.0 - 0.5 K/uL   Basophils Relative 0 %   Basophils Absolute 0.0 0.0 - 0.1 K/uL   Immature Granulocytes 0 %   Abs Immature Granulocytes 0.02 0.00 - 0.07 K/uL    Comment: Performed at [facility], [address] [place], [place] [address]  CBC with Differential/Platelet     Status: Abnormal   Collection Time: 11/08/21  1:08 AM  Result Value Ref Range   WBC 7.4 4.0 - 10.5 K/uL   RBC 3.57 (L) 3.87 - 5.11 MIL/uL   Hemoglobin 11.2 (L) 12.0 - 15.0 g/dL   HCT 92.1 (L) 19.4 - 17.4 %   MCV 91.9 80.0 - 100.0 fL   MCH 31.4 26.0 - 34.0 pg   MCHC 34.1 30.0 - 36.0 g/dL   RDW 08.1 44.8 - 18.5 %   Platelets 146 (L) 150 - 400 K/uL    Comment: REPEATED TO VERIFY   nRBC 0.0 0.0 - 0.2 %   Neutrophils Relative % 60 %   Neutro Abs 4.4 1.7 - 7.7 K/uL   Lymphocytes  Relative 29 %   Lymphs Abs 2.2 0.7 - 4.0 K/uL   Monocytes Relative 10 %   Monocytes Absolute 0.7 0.1 - 1.0 K/uL   Eosinophils Relative 1 %   Eosinophils Absolute 0.1 0.0 - 0.5 K/uL   Basophils Relative 0 %   Basophils Absolute 0.0 0.0 - 0.1 K/uL   Immature Granulocytes 0 %   Abs Immature Granulocytes 0.01 0.00 - 0.07 K/uL    Comment: Performed at [facility], [address] [place], [place] [address]  Comprehensive metabolic panel     Status: Abnormal   Collection Time: 11/08/21  1:08 AM  Result Value Ref Range   Sodium 138 135 - 145 mmol/L   Potassium 3.4 (L) 3.5 - 5.1 mmol/L   Chloride 105 98 - 111 mmol/L   CO2 26 22 - 32 mmol/L   Glucose, Bld 117 (H) 70 - 99 mg/dL    Comment: Glucose reference range applies only to samples taken after fasting for at least 8 hours.   BUN 6 6 - 20 mg/dL   Creatinine, Ser 7.02 0.44 - 1.00 mg/dL   Calcium 7.8 (L) 8.9 - 10.3 mg/dL   Total Protein 5.2 (L) 6.5 - 8.1 g/dL   Albumin 2.9 (L) 3.5 - 5.0 g/dL   AST 16 15 - 41 U/L   ALT 13 0 - 44 U/L   Alkaline Phosphatase 43 38 - 126 U/L   Total Bilirubin 0.3 0.3 - 1.2 mg/dL   GFR, Estimated >63 >78 mL/min    Comment: (NOTE) Calculated using the CKD-EPI Creatinine Equation (2021)    Anion gap 7 5 - 15    Comment: Performed at [facility], [address], [place], [place] [address]  Magnesium     Status: None   Collection Time: 11/08/21  1:08 AM  Result Value Ref Range   Magnesium 1.7 1.7 - 2.4 mg/dL    Comment: Performed at [facility], [address], [place], [place] [address]    DG Hand Complete Right  Result Date: 11/07/2021 CLINICAL DATA:  Right hand animal bite EXAM: RIGHT HAND - COMPLETE 3+ VIEW  COMPARISON:  None Available. FINDINGS: Soft tissue swelling along the dorsum of the wrist. No acute bony abnormality. Specifically, no fracture, subluxation, or dislocation. No radiopaque foreign body or soft tissue gas. IMPRESSION: No fracture or foreign body. Electronically  Signed   By: Charlett Nose M.D.   On: 11/07/2021 17:30    Review of Systems  Constitutional:  Negative for chills, diaphoresis and fever.  HENT:  Negative for ear discharge, ear pain, hearing loss and tinnitus.   Eyes:  Negative for photophobia and pain.  Respiratory:  Negative for cough and shortness of breath.   Cardiovascular:  Negative for chest pain.  Gastrointestinal:  Negative for abdominal pain, nausea and vomiting.  Genitourinary:  Negative for dysuria, flank pain, frequency and urgency.  Musculoskeletal:  Positive for arthralgias (Right hand). Negative for back pain, myalgias and neck pain.  Neurological:  Negative for dizziness and headaches.  Hematological:  Does not bruise/bleed easily.  Psychiatric/Behavioral:  The patient is not nervous/anxious.    Blood pressure 119/62, pulse 63, temperature 98.1 F (36.7 C), temperature source Oral, resp. rate 18, height 5\' 8"  (1.727 m), weight 63.5 kg, SpO2 98 %. Physical Exam Constitutional:      General: She is not in acute distress.    Appearance: She is well-developed. She is not diaphoretic.  HENT:     Head: Normocephalic and atraumatic.  Eyes:     General: No scleral icterus.       Right eye: No discharge.        Left eye: No discharge.     Conjunctiva/sclera: Conjunctivae normal.  Cardiovascular:     Rate and Rhythm: Normal rate and regular rhythm.  Pulmonary:     Effort: Pulmonary effort is normal. No respiratory distress.  Musculoskeletal:     Cervical back: Normal range of motion.     Comments: Right shoulder, elbow, wrist, digits- 2 small healed puncture wounds dorsum wrist and thenar, small fluctuant area dorsum of hand distal to wrist, mild TTP, no pain with passive ext of fingers, able to actively range wrist ~90 degrees, no instability, no blocks to motion  Sens  Ax/R/M/U intact  Mot   Ax/ R/ PIN/ M/ AIN/ U intact  Rad 2+  Skin:    General: Skin is warm and dry.  Neurological:     Mental Status: She is alert.   Psychiatric:        Mood and Affect: Mood normal.        Behavior: Behavior normal.     Assessment/Plan: Right hand cellulitis -- No s/sx of septic joint or tenosynovitis. Advise continue IV abx. Will give splint for comfort. Will make NPO after MN for reevaluation tomorrow morning.    , PA-C Orthopedic Surgery [phone] 11/09/2021, 10:53 AM

## 2021-11-09 NOTE — Progress Notes (Signed)
PROGRESS NOTE  Heather Hess  DOB: 07-27-1960  PCP: Teena Irani, PA-C PJA:[id number]  DOA: 11/07/2021  LOS: 2 days  Hospital Day: 3  Brief narrative: Heather Hess is a 61 y.o. female with PMH significant for bipolar disorder, GAD. Patient presented to the ED on 9/10 with worsening wound at the site of cat bite. 9/7, patient had a cat bite to his right hand.  By the next day, it became red, swollen, warm and tender.  She went to local urgent care.  She was discharged on oral Augmentin which he started the same day.  Despite taking it for about 4 doses, he did not see improvement in his symptoms and hence came to med [facility] ED on 9/10.  In the ED, patient was hemodynamically stable Labs unremarkable including normal WBC X-ray right hand showed soft tissue swelling along the dorsum of the wrist without any acute bony abnormality or foreign body or soft tissue gas.   Given IV vancomycin and IV Unasyn Admitted to [facility]   Subjective: Patient was seen and examined this AM.   I had an expectation that her pain level would improve today.  However she has significant pain and tenderness on right hand.  There are also 2 isolated areas of swelling with extremely tender. WBC count normal.  No fever Orthopedic consult called.  Assessment and plan: Right hand cellulitis following cat bite Cat bite on 9/7 with progressive inflammation that did not respond to 2 days of oral Augmentin Admitted for failure of outpatient antibiotics. Currently on IV vancomycin and IV Unasyn. I had an expectation that her pain level would improve today.  However she has significant pain and tenderness on right hand.  There are also 2 isolated areas of swelling with extremely tender. WBC count normal.  No fever Orthopedic consult called. Continue pain control  Hypotension No evidence of sepsis.  Blood pressure improved with hydration.  Generalized anxiety disorder Bipolar disorder Continue  Cymbalta   Goals of care   Code Status: Full Code    Mobility: Encourage ambulation  Skin assessment:     Nutritional status:  Body mass index is 21.29 kg/m.          Diet:  Diet Order             Diet regular Room service appropriate? Yes; Fluid consistency: Thin  Diet effective now                   DVT prophylaxis:  SCDs Start: 11/08/21 0052   Antimicrobials: IV Unasyn, IV vancomycin Fluid: No longer on IV fluid Consultants: None Family Communication: None at bedside  Status is: Inpatient  Continue in-hospital care because: Pain not controlled, infection seems to be getting worse.  Orthopedics consulted Level of care: Med-Surg   Dispo: The patient is from: Home              Anticipated d/c is to: Pending medical course              Patient currently is not medically stable to d/c.   Difficult to place patient No     Infusions:   ampicillin-sulbactam (UNASYN) IV 3 g (11/09/21 3419)   vancomycin 750 mg (11/09/21 0858)    Scheduled Meds:  DULoxetine  60 mg Oral BID    PRN meds: acetaminophen **OR** acetaminophen, ketorolac, morphine injection, naLOXone (NARCAN)  injection, ondansetron (ZOFRAN) IV   Antimicrobials: Anti-infectives (From admission, onward)    Start  Dose/Rate Route Frequency Ordered Stop   11/08/21 0800  vancomycin (VANCOREADY) IVPB 750 mg/150 mL        750 mg 150 mL/hr over 60 Minutes Intravenous Every 12 hours 11/07/21 2146     11/07/21 1930  Ampicillin-Sulbactam (UNASYN) 3 g in sodium chloride 0.9 % 100 mL IVPB        3 g 200 mL/hr over 30 Minutes Intravenous Every 6 hours 11/07/21 1916     11/07/21 1930  vancomycin (VANCOCIN) IVPB 1000 mg/200 mL premix        1,000 mg 200 mL/hr over 60 Minutes Intravenous  Once 11/07/21 1916 11/07/21 2134       Objective: Vitals:   11/09/21 0443 11/09/21 0852  BP: 112/68 119/62  Pulse: (!) 54 63  Resp: 17 18  Temp: 98.1 F (36.7 C) 98.1 F (36.7 C)  SpO2: 100% 98%     Intake/Output Summary (Last 24 hours) at 11/09/2021 1031 Last data filed at 11/09/2021 0300 Gross per 24 hour  Intake 900 ml  Output --  Net 900 ml   Filed Weights   11/07/21 1714 11/08/21 0500  Weight: 63.5 kg 63.5 kg   Weight change:  Body mass index is 21.29 kg/m.   Physical Exam: General exam: Pleasant, middle-aged Caucasian female.  Not in physical distress Skin: No rashes, lesions or ulcers. HEENT: Atraumatic, normocephalic, no obvious bleeding Lungs: Clear to auscultation bilaterally CVS: Regular rate and rhythm, no murmur GI/Abd soft, nontender, nondistended, bowel sound present CNS: Alert, awake, oriented x3 Psychiatry: Mood appropriate Extremities: No pedal edema, no calf tenderness.  Right hand with swelling, redness, tenderness on the dorsum.  Overall improving swelling but 2 isolated areas of significant tenderness in the wrist.  Data Review: I have personally reviewed the laboratory data and studies available.  F/u labs not needed Unresulted Labs (From admission, onward)    None       Signed, Lorin Glass, MD [facility] 11/09/2021

## 2021-11-10 MED ORDER — POTASSIUM CHLORIDE CRYS ER 20 MEQ PO TBCR
40.0000 meq | EXTENDED_RELEASE_TABLET | Freq: Once | ORAL | Status: AC
  Administered 2021-11-10: 40 meq via ORAL
  Filled 2021-11-10: qty 2

## 2021-11-10 MED ORDER — AMOXICILLIN-POT CLAVULANATE 875-125 MG PO TABS: 1.0000 | ORAL_TABLET | Freq: Two times a day (BID) | ORAL | 0 refills | Status: AC

## 2021-11-10 MED ORDER — DOXYCYCLINE HYCLATE 100 MG PO TABS: 100.0000 mg | ORAL_TABLET | Freq: Two times a day (BID) | ORAL | 0 refills | Status: AC

## 2021-11-10 NOTE — Progress Notes (Signed)
Pharmacy Antibiotic Note  Hanne Kegg is a 61 y.o. female for which pharmacy has been consulted for vancomycin dosing for cellulitis.  Renal function remains stable with CrCl >60 mL/min. Ortho consulted and aspirated wound - did not show evidence of overt pus.   Plan: Continue Unasyn 3g Q6H Continue vancomycin 750mg  Q12H (eAUC 486) F/u plan for duration of IV antibiotics - if continued, would be due for level assessment in the next 24 hours Monitor renal function for dose changes  Height: 5\' 8"  (172.7 cm) Weight: 63.5 kg (139 lb 15.9 oz) IBW/kg (Calculated) : 63.9  Temp (24hrs), Avg:98.5 F (36.9 C), Min:98.4 F (36.9 C), Max:98.6 F (37 C)  Recent Labs  Lab 11/07/21 1919 11/08/21 0108  WBC 7.5 7.4  CREATININE 0.68 0.74    Estimated Creatinine Clearance: 75.9 mL/min (by C-G formula based on SCr of 0.74 mg/dL).    Allergies  Allergen Reactions   Nsaids Hives    (Patient reports that she tolerates ibuprofen and mobic at home without any side-effects). Pt stated she is no longer allergic to Nsaids.      Thank you for allowing pharmacy to be a part of this patient's care.  01/07/22, PharmD Clinical Pharmacist 11/10/2021 2:31 PM

## 2021-11-10 NOTE — Discharge Instructions (Addendum)
Heather Hess,  You were in the hospital because of a hand infection from a cat bite. This was treated with IV antibiotics and has improved. The orthopedic surgeon drained some fluid from your hand and removed some fluid that he thought was not infectious. Please continue antibiotics as prescribed.

## 2021-11-10 NOTE — Discharge Summary (Signed)
Physician Discharge Summary   Patient: Heather Hess MRN: [id number] DOB: 1960-03-01  Admit date:     11/07/2021  Discharge date: 11/10/21  Discharge Physician: Jacquelin Hawking, MD   PCP: Teena Irani, PA-C   Recommendations at discharge:  Follow-up with PCP and orthopedic surgery  Discharge Diagnoses: Principal Problem:   Cellulitis of right hand Active Problems:   Cat bite   GAD (generalized anxiety disorder)  Resolved Problems:   * No resolved hospital problems. [facility] Course:  Secondary to cat bite. No blood or wound cultures obtained. Patient started empirically on Unasyn and Vancomycin IV. Orthopedic surgery consulted and performed bedside I&D on 9/13 expressing non-purulent fluid. Recommendation from orthopedic surgery for non-operative management. Patient transitioned to Doxycycline and Augmentin on discharge to complete a total of 10 days of antibiotic therapy. Patient to follow-up with orthopedic surgery as an outpatient.  Consultants: Orthopedic surgery Procedures performed: Bedside I&D (9/13)  Disposition: Home Diet recommendation: Regular diet  DISCHARGE MEDICATION: Allergies as of 11/10/2021       Reactions   Nsaids Hives   (Patient reports that she tolerates ibuprofen and mobic at home without any side-effects). Pt stated she is no longer allergic to Nsaids.         Medication List     STOP taking these medications    traZODone 50 MG tablet Commonly known as: DESYREL       TAKE these medications    amoxicillin-clavulanate 875-125 MG tablet Commonly known as: AUGMENTIN Take 1 tablet by mouth 2 (two) times daily for 8 days.   doxycycline 100 MG tablet Commonly known as: VIBRA-TABS Take 1 tablet (100 mg total) by mouth 2 (two) times daily for 8 days.   DULoxetine 60 MG capsule Commonly known as: CYMBALTA Take 1 capsule (60 mg total) by mouth 2 (two) times daily.   ibuprofen 200 MG tablet Commonly known as: ADVIL Take 800 mg by mouth  daily as needed (pain).   vitamin k 100 MCG tablet Take 100 mcg by mouth daily.        Follow-up Information     Teena Irani, PA-C. Schedule an appointment as soon as possible for a visit in 1 week(s).   Specialty: Physician Assistant Why: For wound re-check, For hospital follow-up Contact information: [address] [place] [address] [phone]                Discharge Exam: BP 124/65 (BP Location: Left Arm)   Pulse 64   Temp 98.4 F (36.9 C) (Oral)   Resp 16   Ht 5\' 8"  (1.727 m)   Wt 63.5 kg   SpO2 98%   BMI 21.29 kg/m   General exam: Appears calm and comfortable Respiratory system: Respiratory effort normal. Cardiovascular system: S1 & S2 heard, RRR. No murmurs, rubs, gallops or clicks. Musculoskeletal: Mild left hand/wrist edema with some tenderness of metacarpals.  Skin: No significant cellulitis noted. Psychiatry: Judgement and insight appear normal. Mood & affect appropriate.   Condition at discharge: stable  The results of significant diagnostics from this hospitalization (including imaging, microbiology, ancillary and laboratory) are listed below for reference.   Imaging Studies: DG Hand Complete Right  Result Date: 11/07/2021 CLINICAL DATA:  Right hand animal bite EXAM: RIGHT HAND - COMPLETE 3+ VIEW COMPARISON:  None Available. FINDINGS: Soft tissue swelling along the dorsum of the wrist. No acute bony abnormality. Specifically, no fracture, subluxation, or dislocation. No radiopaque foreign body or soft tissue gas. IMPRESSION: No  fracture or foreign body. Electronically Signed   By: Charlett Nose M.D.   On: 11/07/2021 17:30    Microbiology: Results for orders placed or performed during the hospital encounter of 08/30/19  Urine culture     Status: Abnormal   Collection Time: 08/30/19  6:30 PM   Specimen: Urine, Clean Catch  Result Value Ref Range Status   Specimen Description   Final    URINE, CLEAN CATCH Performed at [facility], [address] [place], [place] [address]    Special Requests   Final    NONE Performed at [facility], [address] [place], [place] [address]    Culture (A)  Final    >=100,000 COLONIES/mL ESCHERICHIA COLI Confirmed Extended Spectrum Beta-Lactamase Producer (ESBL).  In bloodstream infections from ESBL organisms, carbapenems are preferred over piperacillin/tazobactam. They are shown to have a lower risk of mortality.    Report Status 09/02/2019 FINAL  Final   Organism ID, Bacteria ESCHERICHIA COLI (A)  Final      Susceptibility   Escherichia coli - MIC*    AMPICILLIN >=32 RESISTANT Resistant     CEFAZOLIN >=64 RESISTANT Resistant     CEFTRIAXONE >=64 RESISTANT Resistant     CIPROFLOXACIN 0.5 SENSITIVE Sensitive     GENTAMICIN <=1 SENSITIVE Sensitive     IMIPENEM <=0.25 SENSITIVE Sensitive     NITROFURANTOIN <=16 SENSITIVE Sensitive     TRIMETH/SULFA <=20 SENSITIVE Sensitive     AMPICILLIN/SULBACTAM >=32 RESISTANT Resistant     PIP/TAZO <=4 SENSITIVE Sensitive     * >=100,000 COLONIES/mL ESCHERICHIA COLI    Labs: CBC: Recent Labs  Lab 11/07/21 1919 11/08/21 0108  WBC 7.5 7.4  NEUTROABS 4.9 4.4  HGB 12.9 11.2*  HCT 38.8 32.8*  MCV 92.8 91.9  PLT 172 146*   Basic Metabolic Panel: Recent Labs  Lab 11/07/21 1919 11/08/21 0108  NA 139 138  K 3.6 3.4*  CL 100 105  CO2 28 26  GLUCOSE 88 117*  BUN 8 6  CREATININE 0.68 0.74  CALCIUM 8.7* 7.8*  MG  --  1.7   Liver Function Tests: Recent Labs  Lab 11/08/21 0108  AST 16  ALT 13  ALKPHOS 43  BILITOT 0.3  PROT 5.2*  ALBUMIN 2.9*    Discharge time spent: 35 minutes.  Signed: Jacquelin Hawking, MD [facility] 11/10/2021

## 2022-06-29 ENCOUNTER — Encounter (HOSPITAL_BASED_OUTPATIENT_CLINIC_OR_DEPARTMENT_OTHER): Payer: Self-pay | Admitting: Emergency Medicine

## 2022-06-29 ENCOUNTER — Emergency Department (HOSPITAL_BASED_OUTPATIENT_CLINIC_OR_DEPARTMENT_OTHER): Payer: No Typology Code available for payment source

## 2022-06-29 ENCOUNTER — Inpatient Hospital Stay (HOSPITAL_BASED_OUTPATIENT_CLINIC_OR_DEPARTMENT_OTHER)
Admission: EM | Admit: 2022-06-29 | Discharge: 2022-07-02 | DRG: 872 | Disposition: A | Discharge status: Home / Self Care | Payer: No Typology Code available for payment source | Attending: Internal Medicine | Admitting: Internal Medicine

## 2022-06-29 ENCOUNTER — Other Ambulatory Visit: Payer: Self-pay

## 2022-06-29 ENCOUNTER — Other Ambulatory Visit (HOSPITAL_BASED_OUTPATIENT_CLINIC_OR_DEPARTMENT_OTHER): Payer: Self-pay

## 2022-06-29 DIAGNOSIS — F17290 Nicotine dependence, other tobacco product, uncomplicated: Secondary | ICD-10-CM | POA: Diagnosis present

## 2022-06-29 DIAGNOSIS — F319 Bipolar disorder, unspecified: Secondary | ICD-10-CM | POA: Diagnosis present

## 2022-06-29 DIAGNOSIS — N12 Tubulo-interstitial nephritis, not specified as acute or chronic: Principal | ICD-10-CM

## 2022-06-29 DIAGNOSIS — L0291 Cutaneous abscess, unspecified: Secondary | ICD-10-CM | POA: Diagnosis present

## 2022-06-29 DIAGNOSIS — N10 Acute tubulo-interstitial nephritis: Secondary | ICD-10-CM | POA: Diagnosis present

## 2022-06-29 DIAGNOSIS — E871 Hypo-osmolality and hyponatremia: Secondary | ICD-10-CM | POA: Diagnosis present

## 2022-06-29 DIAGNOSIS — A419 Sepsis, unspecified organism: Principal | ICD-10-CM | POA: Diagnosis present

## 2022-06-29 DIAGNOSIS — E876 Hypokalemia: Secondary | ICD-10-CM | POA: Diagnosis present

## 2022-06-29 DIAGNOSIS — Z87440 Personal history of urinary (tract) infections: Secondary | ICD-10-CM | POA: Diagnosis not present

## 2022-06-29 DIAGNOSIS — Z79899 Other long term (current) drug therapy: Secondary | ICD-10-CM | POA: Diagnosis not present

## 2022-06-29 LAB — URINALYSIS, ROUTINE W REFLEX MICROSCOPIC
Bilirubin Urine: NEGATIVE
Glucose, UA: NEGATIVE mg/dL
Ketones, ur: 15 mg/dL — AB
Nitrite: NEGATIVE
Protein, ur: 100 mg/dL — AB
Specific Gravity, Urine: 1.016 (ref 1.005–1.030)
WBC, UA: >50 WBC/hpf (ref 0–5)
pH: 5.5 (ref 5.0–8.0)

## 2022-06-29 LAB — CBC WITH DIFFERENTIAL/PLATELET
Abs Immature Granulocytes: 0.22 10*3/uL — ABNORMAL HIGH (ref 0.00–0.07)
Basophils Absolute: 0 10*3/uL (ref 0.0–0.1)
Basophils Relative: 0 %
Eosinophils Absolute: 0 10*3/uL (ref 0.0–0.5)
Eosinophils Relative: 0 %
HCT: 39 % (ref 36.0–46.0)
Hemoglobin: 13.4 g/dL (ref 12.0–15.0)
Immature Granulocytes: 1 %
Lymphocytes Relative: 3 %
Lymphs Abs: 0.7 10*3/uL (ref 0.7–4.0)
MCH: 31.7 pg (ref 26.0–34.0)
MCHC: 34.4 g/dL (ref 30.0–36.0)
MCV: 92.2 fL (ref 80.0–100.0)
Monocytes Absolute: 1.5 10*3/uL — ABNORMAL HIGH (ref 0.1–1.0)
Monocytes Relative: 7 %
Neutro Abs: 19.6 10*3/uL — ABNORMAL HIGH (ref 1.7–7.7)
Neutrophils Relative %: 89 %
Platelets: 134 10*3/uL — ABNORMAL LOW (ref 150–400)
RBC: 4.23 MIL/uL (ref 3.87–5.11)
RDW: 12.6 % (ref 11.5–15.5)
WBC: 22 10*3/uL — ABNORMAL HIGH (ref 4.0–10.5)
nRBC: 0 % (ref 0.0–0.2)

## 2022-06-29 LAB — COMPREHENSIVE METABOLIC PANEL
ALT: 30 U/L (ref 0–44)
AST: 32 U/L (ref 15–41)
Albumin: 3.8 g/dL (ref 3.5–5.0)
Alkaline Phosphatase: 73 U/L (ref 38–126)
Anion gap: 10 (ref 5–15)
BUN: 14 mg/dL (ref 6–20)
CO2: 27 mmol/L (ref 22–32)
Calcium: 8.4 mg/dL — ABNORMAL LOW (ref 8.9–10.3)
Chloride: 96 mmol/L — ABNORMAL LOW (ref 98–111)
Creatinine, Ser: 0.82 mg/dL (ref 0.44–1.00)
GFR, Estimated: >60 mL/min (ref >60)
Glucose, Bld: 130 mg/dL — ABNORMAL HIGH (ref 70–99)
Potassium: 3.3 mmol/L — ABNORMAL LOW (ref 3.5–5.1)
Sodium: 133 mmol/L — ABNORMAL LOW (ref 135–145)
Total Bilirubin: 0.7 mg/dL (ref 0.3–1.2)
Total Protein: 6.6 g/dL (ref 6.5–8.1)

## 2022-06-29 LAB — LACTIC ACID, PLASMA
Lactic Acid, Venous: 0.9 mmol/L (ref 0.5–1.9)
Lactic Acid, Venous: 0.6 mmol/L (ref 0.5–1.9)

## 2022-06-29 MED ORDER — POTASSIUM CHLORIDE CRYS ER 20 MEQ PO TBCR
40.0000 meq | EXTENDED_RELEASE_TABLET | Freq: Once | ORAL | Status: AC
  Administered 2022-06-29: 40 meq via ORAL
  Filled 2022-06-29: qty 2

## 2022-06-29 MED ORDER — IOHEXOL 300 MG/ML  SOLN
100.0000 mL | Freq: Once | INTRAMUSCULAR | Status: AC | PRN
  Administered 2022-06-29: 65 mL via INTRAVENOUS

## 2022-06-29 MED ORDER — SENNOSIDES-DOCUSATE SODIUM 8.6-50 MG PO TABS
1.0000 | ORAL_TABLET | Freq: Every evening | ORAL | Status: DC | PRN
  Administered 2022-07-01: 1 via ORAL
  Filled 2022-06-29: qty 1

## 2022-06-29 MED ORDER — HYDROCODONE-ACETAMINOPHEN 5-325 MG PO TABS
1.0000 | ORAL_TABLET | ORAL | Status: DC | PRN
  Administered 2022-06-29 – 2022-07-02 (×9): 2 via ORAL
  Filled 2022-06-29 (×9): qty 2

## 2022-06-29 MED ORDER — ACETAMINOPHEN 650 MG RE SUPP: 650.0000 mg | Freq: Four times a day (QID) | RECTAL | Status: DC | PRN

## 2022-06-29 MED ORDER — SODIUM CHLORIDE 0.9 % IV BOLUS
1000.0000 mL | Freq: Once | INTRAVENOUS | Status: AC
  Administered 2022-06-29: 1000 mL via INTRAVENOUS

## 2022-06-29 MED ORDER — SODIUM CHLORIDE 0.9 % IV SOLN
1.0000 g | Freq: Three times a day (TID) | INTRAVENOUS | Status: DC
  Administered 2022-06-29 – 2022-07-02 (×9): 1 g via INTRAVENOUS
  Filled 2022-06-29 (×9): qty 20

## 2022-06-29 MED ORDER — SODIUM CHLORIDE 0.9 % IV SOLN
INTRAVENOUS | Status: DC
  Administered 2022-06-30: 100 mL/h via INTRAVENOUS
  Administered 2022-07-01: 75 mL/h via INTRAVENOUS
  Administered 2022-07-01: 100 mL/h via INTRAVENOUS

## 2022-06-29 MED ORDER — ACETAMINOPHEN 325 MG PO TABS: 650.0000 mg | ORAL_TABLET | Freq: Four times a day (QID) | ORAL | Status: DC | PRN

## 2022-06-29 MED ORDER — ONDANSETRON HCL 4 MG PO TABS: 4.0000 mg | ORAL_TABLET | Freq: Four times a day (QID) | ORAL | Status: DC | PRN

## 2022-06-29 MED ORDER — ONDANSETRON HCL 4 MG/2ML IJ SOLN: 4.0000 mg | Freq: Four times a day (QID) | INTRAMUSCULAR | Status: DC | PRN

## 2022-06-29 MED ORDER — FENTANYL CITRATE PF 50 MCG/ML IJ SOSY
50.0000 ug | PREFILLED_SYRINGE | Freq: Once | INTRAMUSCULAR | Status: AC
  Administered 2022-06-29: 50 ug via INTRAVENOUS
  Filled 2022-06-29: qty 1

## 2022-06-29 MED ORDER — KETOROLAC TROMETHAMINE 15 MG/ML IJ SOLN
15.0000 mg | Freq: Once | INTRAMUSCULAR | Status: AC
  Administered 2022-06-29: 15 mg via INTRAVENOUS
  Filled 2022-06-29: qty 1

## 2022-06-29 MED ORDER — ACETAMINOPHEN 500 MG PO TABS
1000.0000 mg | ORAL_TABLET | Freq: Once | ORAL | Status: AC
  Administered 2022-06-29: 1000 mg via ORAL
  Filled 2022-06-29: qty 2

## 2022-06-29 MED ORDER — SODIUM CHLORIDE 0.9 % IV SOLN
1.0000 g | Freq: Once | INTRAVENOUS | Status: AC
  Administered 2022-06-29: 1 g via INTRAVENOUS
  Filled 2022-06-29: qty 10

## 2022-06-29 MED ORDER — DULOXETINE HCL 60 MG PO CPEP
60.0000 mg | ORAL_CAPSULE | Freq: Two times a day (BID) | ORAL | Status: DC
  Administered 2022-06-29 – 2022-07-02 (×6): 60 mg via ORAL
  Filled 2022-06-29 (×6): qty 1

## 2022-06-29 MED ORDER — ENOXAPARIN SODIUM 40 MG/0.4ML IJ SOSY
40.0000 mg | PREFILLED_SYRINGE | INTRAMUSCULAR | Status: DC
  Administered 2022-06-29 – 2022-07-01 (×3): 40 mg via SUBCUTANEOUS
  Filled 2022-06-29 (×3): qty 0.4

## 2022-06-29 NOTE — Progress Notes (Signed)
Pharmacy Antibiotic Note  Heather Hess is a 62 y.o. female admitted on 06/29/2022 with UTI.  Pharmacy has been consulted for meropenem dosing. Pt is afebrile but WBC is elevated at 22. SCr is WNL. Known history of ESBL organisms.   Plan: Meropenem 1g IV Q8H F/u renal fxn, C&S, clinical status De-escalate as able  Height: 5\' 8"  (172.7 cm) Weight: 59 kg (130 lb) IBW/kg (Calculated) : 63.9  Temp (24hrs), Avg:99.3 F (37.4 C), Min:99.3 F (37.4 C), Max:99.3 F (37.4 C)  Recent Labs  Lab 06/29/22 1018  WBC 22.0*  CREATININE 0.82    Estimated Creatinine Clearance: 68 mL/min (by C-G formula based on SCr of 0.82 mg/dL).    No Known Allergies  Antimicrobials this admission: CTX x 1 5/1 Mero 5/1>>  Adjustments this admission: N/A  Microbiology results: Pending  Thank you for allowing pharmacy to be a part of this patient's care.  Makensie Mulhall, Drake Leach 06/29/2022 12:21 PM

## 2022-06-29 NOTE — ED Provider Notes (Addendum)
[place] EMERGENCY DEPARTMENT AT [facility] Provider Note   CSN: [id number] Arrival date & time: 06/29/22  3086     History  Chief Complaint  Patient presents with   Flank Pain    Heather Hess is a 62 y.o. female, history of recurrent UTIs, pyelonephritis, who presents to the ED secondary to right flank pain, fever, chills, body aches, for the last 4 days, then at that has been worsening over the last couple days.  She states she just feels very weak, and has severe right flank pain.  Has a foul-smelling odor when she urinates, but denies any dysuric dysuria, urinary frequency or urgency.  Notes that she frequently gets urinary tract infections, and they are not treated appropriately and has to typically always go back for second or third antibiotic.  Denies any history of diabetes.  No vaginal discharge.  Endorses nausea, no vomiting.  Highest fever has been 104F.    Home Medications Prior to Admission medications   Medication Sig Start Date End Date Taking? Authorizing Provider  Ascorbic Acid (VITAMIN C PO) Take 1 tablet by mouth at bedtime.   Yes [provider]  DULoxetine (CYMBALTA) 60 MG capsule Take 1 capsule (60 mg total) by mouth 2 (two) times daily. 02/01/16  Yes Adonis Brook, NP      Allergies    Patient has no known allergies.    Review of Systems   Review of Systems  Constitutional:  Positive for chills and fever.  Respiratory:  Negative for shortness of breath.   Genitourinary:  Positive for flank pain.    Physical Exam Updated Vital Signs BP 126/60 (BP Location: Right Arm)   Pulse 79   Temp (!) 102.9 F (39.4 C) (Oral)   Resp 16   Ht 5\' 8"  (1.727 m)   Wt 59 kg   SpO2 98%   BMI 19.77 kg/m  Physical Exam Vitals and nursing note reviewed.  Constitutional:      General: She is not in acute distress.    Appearance: She is well-developed. She is ill-appearing.  HENT:     Head: Normocephalic and atraumatic.  Eyes:      Conjunctiva/sclera: Conjunctivae normal.  Cardiovascular:     Rate and Rhythm: Normal rate and regular rhythm.     Heart sounds: No murmur heard. Pulmonary:     Effort: Pulmonary effort is normal. No respiratory distress.     Breath sounds: Normal breath sounds.  Abdominal:     Palpations: Abdomen is soft.     Tenderness: There is abdominal tenderness. There is right CVA tenderness.  Musculoskeletal:        General: No swelling.     Cervical back: Neck supple.  Skin:    General: Skin is warm and dry.     Capillary Refill: Capillary refill takes less than 2 seconds.  Neurological:     Mental Status: She is alert.  Psychiatric:        Mood and Affect: Mood normal.     ED Results / Procedures / Treatments   Labs (all labs ordered are listed, but only abnormal results are displayed) Labs Reviewed  URINALYSIS, ROUTINE W REFLEX MICROSCOPIC - Abnormal; Notable for the following components:      Result Value   APPearance HAZY (*)    Hgb urine dipstick Alesha Jaffee (*)    Ketones, ur 15 (*)    Protein, ur 100 (*)    Leukocytes,Ua LARGE (*)    Bacteria, UA MANY (*)  All other components within normal limits  CBC WITH DIFFERENTIAL/PLATELET - Abnormal; Notable for the following components:   WBC 22.0 (*)    Platelets 134 (*)    Neutro Abs 19.6 (*)    Monocytes Absolute 1.5 (*)    Abs Immature Granulocytes 0.22 (*)    All other components within normal limits  COMPREHENSIVE METABOLIC PANEL - Abnormal; Notable for the following components:   Sodium 133 (*)    Potassium 3.3 (*)    Chloride 96 (*)    Glucose, Bld 130 (*)    Calcium 8.4 (*)    All other components within normal limits  CULTURE, BLOOD (ROUTINE X 2)  CULTURE, BLOOD (ROUTINE X 2)  LACTIC ACID, PLASMA  LACTIC ACID, PLASMA    EKG None  Radiology CT ABDOMEN PELVIS W CONTRAST  Result Date: 06/29/2022 CLINICAL DATA:  History of abscess. Prior hysterectomy and cholecystectomy with right flank pain for 4 days. Hematuria  EXAM: CT ABDOMEN AND PELVIS WITH CONTRAST TECHNIQUE: Multidetector CT imaging of the abdomen and pelvis was performed using the standard protocol following bolus administration of intravenous contrast. RADIATION DOSE REDUCTION: This exam was performed according to the departmental dose-optimization program which includes automated exposure control, adjustment of the mA and/or kV according to patient size and/or use of iterative reconstruction technique. CONTRAST:  65mL OMNIPAQUE IOHEXOL 300 MG/ML  SOLN COMPARISON:  CT 08/30/2019 abdomen and pelvis. Ultrasound 08/31/2017. Older exams as well FINDINGS: Lower chest: There is some linear opacity lung bases likely scar or atelectasis. No pleural effusion. Hepatobiliary: Previous cholecystectomy. Slight ectasia of the biliary tree but normal tapering of the duct towards the pancreatic head. This is stable. No space-occupying liver lesion. Patent portal vein. Pancreas: Unremarkable. No pancreatic ductal dilatation or surrounding inflammatory changes. Spleen: Normal in size without focal abnormality. Adrenals/Urinary Tract: Adrenal glands are preserved. The left kidney has some tiny low-attenuation lesions which are too Maison Kestenbaum to completely characterize though likely benign cysts. Bosniak 2 lesions. No left-sided renal collecting system dilatation. There is wall thickening of the urinary bladder. Please correlate for any evidence of cystitis. The right kidney has some areas of hazy enhancement with the adjacent stranding. Possibilities include pyelonephritis. Some of the areas are confluent and somewhat focal. For example focus laterally along the lower aspect of the right kidney on series 2, image 34 measuring 13 mm. No soft tissue gas. There is benign Bosniak 1 cyst separately in the lateral aspect of the right kidney on series 2, image 28. Bosniak 1 lesion. No follow-up of the benign cyst. There is slight ectasia of the collecting system with some urothelial thickening.  Stomach/Bowel: Moderate diffuse colonic stool. Few sigmoid colon diverticula. Normal retrocecal appendix. The stomach and Unice Vantassel bowel are nondilated. Vascular/Lymphatic: Normal caliber aorta and IVC with scattered vascular calcifications. Circumaortic left renal vein. No specific abnormal lymph node enlargement identified in the abdomen and pelvis. Reproductive: Status post hysterectomy. No adnexal masses. Other: No abdominal wall hernia or abnormality. No abdominopelvic ascites. Musculoskeletal: Mild degenerative changes of the spine and pelvis. Spinal hemangioma at L2. IMPRESSION: Changes of pyelonephritis with heterogeneous enhancement of the right kidney, adjacent stranding. Slight ectasia of the collecting system of the kidney with urothelial thickening. Viann Nielson focal area of low-density in the inferior aspect of the right kidney. Recommend short follow-up to exclude an area of developing phlegmon. No soft tissue gas. Mild wall thickening of the urinary bladder. Diffuse colonic stool.  Normal appendix. Previous cholecystectomy with stable mild ectasia of the biliary  tree Electronically Signed   By: Karen Kays M.D.   On: 06/29/2022 11:42    Procedures Procedures    Medications Ordered in ED Medications  meropenem (MERREM) 1 g in sodium chloride 0.9 % 100 mL IVPB (1 g Intravenous New Bag/Given 06/29/22 1259)  acetaminophen (TYLENOL) tablet 1,000 mg (has no administration in time range)  ketorolac (TORADOL) 15 MG/ML injection 15 mg (15 mg Intravenous Given 06/29/22 1016)  sodium chloride 0.9 % bolus 1,000 mL (0 mLs Intravenous Stopped 06/29/22 1054)  cefTRIAXone (ROCEPHIN) 1 g in sodium chloride 0.9 % 100 mL IVPB (0 g Intravenous Stopped 06/29/22 1054)  iohexol (OMNIPAQUE) 300 MG/ML solution 100 mL (65 mLs Intravenous Contrast Given 06/29/22 1123)  sodium chloride 0.9 % bolus 1,000 mL (1,000 mLs Intravenous New Bag/Given 06/29/22 1215)  fentaNYL (SUBLIMAZE) injection 50 mcg (50 mcg Intravenous Given 06/29/22  1215)    ED Course/ Medical Decision Making/ A&P                             Medical Decision Making Patient is a 62 year old female, here for right-sided back pain has been going on for the last 4 days, associated with foul-smelling urine, and fatigue.  She is ill-appearing on exam, appears that she is not feeling well, was started on ceftriaxone for concern for pyelonephritis obtain urinalysis, blood work, CT abdomen pelvis to rule out an abscess.  She has a history of recurrent pyelo, thus I am suspicious of possible abscess.  Amount and/or Complexity of Data Reviewed Labs: ordered.    Details: White blood cell count of 22,000, nitrite positive UTI Radiology: ordered.    Details: CT abdomen pelvis, shows concern for possible developing abscess, and right kidney, as well as pyelonephritis. Discussion of management or test interpretation with external provider(s): Discussed with patient, she is feeling still feeling poor after the ceftriaxone, fluids, I discussed with hospitalist on-call and she accepts admission of patient.  Patient will be admitted, for further IV antibiotics and evaluation.  She was send not tachycardic or febrile on my exam of her initially.  We obtained blood cultures however, as well as started her on meropenem, per hospitalist Dr Lonzo Cloud, request as well as a lactic acid.  Patient in agreement with plan, requires admission given developing abscess, recurrent pyelo.  Risk OTC drugs. Prescription drug management. Decision regarding hospitalization.    Final Clinical Impression(s) / ED Diagnoses Final diagnoses:  Pyelonephritis  Phlegmon    Rx / DC Orders ED Discharge Orders     None         Chandy Tarman, [place], PA 06/29/22 1355    Jamisen Hawes, [place], [place] 06/29/22 1423    Arby Barrette, MD 07/11/22 2011

## 2022-06-29 NOTE — ED Notes (Signed)
3 rings, 1 necklace and a watch was given to the pt and [facility].Marland Kitchen

## 2022-06-29 NOTE — ED Triage Notes (Signed)
Pt arrives pov, slow gait with c/o RT flank pain with fever x 4 days. Hematuria today.

## 2022-06-29 NOTE — ED Notes (Signed)
Also complained of pain to the right lower back/flank

## 2022-06-29 NOTE — H&P (Signed)
History and Physical    Heather Hess JYN:[id number] DOB: March 13, 1960 DOA: 06/29/2022  I have briefly reviewed the patient's prior medical records in [facility]  PCP: Heather Irani, PA-C  Patient coming from: home  Chief Complaint: 4 days of fever, chills, flank pain, foul-smelling urine  HPI: Heather Hess is a 62 y.o. 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 intake, fever and chills with profuse sweating, foul-smelling urine progressive over the last 4 days.  She got so weak to the point that she was barely able to stand up.  This has happened to her before when she gets UTIs.  She denies any chest pain, denies any shortness of breath.  She denies any vomiting.  Complains of lightheadedness upon standing.  ED Course: In the ED she was febrile up to 102.9, normotensive.  Blood work remarkable for potassium of 3.3, sodium 133, white count 22.0.  Lactic acid was normal at 0.9.  Urinalysis shows many bacteria, pyuria. CT of the abdomen and pelvis showed evidence of pyelonephritis on the right kidney with possible developing phlegmon.  Blood cultures were sent, unfortunately urine culture was not and she did receive antibiotics prior to my evaluation  Review of Systems: All systems reviewed, and apart from HPI, all negative  Past Medical History:  Diagnosis Date   Acute kidney injury (HCC)    Alcohol abuse    Anxiety    Bipolar 1 disorder (HCC)    Chronic back pain    Chronic thoracic back pain    Depression    Herniated cervical disc    Opiate abuse, episodic (HCC)    Septicemia (HCC)     Past Surgical History:  Procedure Laterality Date   ABDOMINAL HYSTERECTOMY     HEMORRHOID SURGERY     LAPAROSCOPIC CHOLECYSTECTOMY     TONSILLECTOMY     TUBAL LIGATION       reports that she quit smoking about 7 years ago. Her smoking use  included cigarettes. She has a 15.00 pack-year smoking history. She has never used smokeless tobacco. She reports current alcohol use. She reports that she does not use drugs.  No Known Allergies  History reviewed. No pertinent family history.  Prior to Admission medications   Medication Sig Start Date End Date Taking? Authorizing Provider  Ascorbic Acid (VITAMIN C PO) Take 1 tablet by mouth at bedtime.   Yes [provider]  DULoxetine (CYMBALTA) 60 MG capsule Take 1 capsule (60 mg total) by mouth 2 (two) times daily. 02/01/16  Yes Adonis Brook, NP    Physical Exam: Vitals:   06/29/22 1532 06/29/22 1550 06/29/22 1553 06/29/22 1635  BP:  (!) 106/55  (!) 96/52  Pulse:  79  74  Resp:  16  16  Temp: (!) 100.5 F (38.1 C)  99.3 F (37.4 C) 99.5 F (37.5 C)  TempSrc: Oral  Oral Oral  SpO2:  99%  100%  Weight:      Height:          Constitutional: NAD, calm, comfortable Eyes: PERRL, lids and conjunctivae normal ENMT: Mucous membranes are moist. Neck: normal, supple Respiratory: clear to auscultation bilaterally, no wheezing, no crackles. Normal respiratory effort. No accessory muscle use.  Cardiovascular: Regular rate and rhythm, no murmurs / rubs / gallops. No extremity edema. 2+ pedal pulses.  Abdomen: no tenderness, no masses palpated. Bowel sounds  positive.  Musculoskeletal: no clubbing / cyanosis. Normal muscle tone.  Significant flank tenderness on right Skin: no rashes Neurologic: CN 2-12 grossly intact. Strength 5/5 in all 4.  Psychiatric: Normal judgment and insight.   Labs on Admission: I have personally reviewed following labs and imaging studies  CBC: Recent Labs  Lab 06/29/22 1018  WBC 22.0*  NEUTROABS 19.6*  HGB 13.4  HCT 39.0  MCV 92.2  PLT 134*   Basic Metabolic Panel: Recent Labs  Lab 06/29/22 1018  NA 133*  K 3.3*  CL 96*  CO2 27  GLUCOSE 130*  BUN 14  CREATININE 0.82  CALCIUM 8.4*   Liver Function Tests: Recent Labs  Lab  06/29/22 1018  AST 32  ALT 30  ALKPHOS 73  BILITOT 0.7  PROT 6.6  ALBUMIN 3.8   Coagulation Profile: No results for input(s): "INR", "PROTIME" in the last 168 hours. BNP (last 3 results) No results for input(s): "PROBNP" in the last 8760 hours. CBG: No results for input(s): "GLUCAP" in the last 168 hours. Thyroid Function Tests: No results for input(s): "TSH", "T4TOTAL", "FREET4", "T3FREE", "THYROIDAB" in the last 72 hours. Urine analysis:    Component Value Date/Time   COLORURINE YELLOW 06/29/2022 0857   APPEARANCEUR HAZY (A) 06/29/2022 0857   LABSPEC 1.016 06/29/2022 0857   PHURINE 5.5 06/29/2022 0857   GLUCOSEU NEGATIVE 06/29/2022 0857   HGBUR SMALL (A) 06/29/2022 0857   BILIRUBINUR NEGATIVE 06/29/2022 0857   KETONESUR 15 (A) 06/29/2022 0857   PROTEINUR 100 (A) 06/29/2022 0857   UROBILINOGEN 1.0 01/08/2015 0030   NITRITE NEGATIVE 06/29/2022 0857   LEUKOCYTESUR LARGE (A) 06/29/2022 0857     Radiological Exams on Admission: CT ABDOMEN PELVIS W CONTRAST  Result Date: 06/29/2022 CLINICAL DATA:  History of abscess. Prior hysterectomy and cholecystectomy with right flank pain for 4 days. Hematuria EXAM: CT ABDOMEN AND PELVIS WITH CONTRAST TECHNIQUE: Multidetector CT imaging of the abdomen and pelvis was performed using the standard protocol following bolus administration of intravenous contrast. RADIATION DOSE REDUCTION: This exam was performed according to the departmental dose-optimization program which includes automated exposure control, adjustment of the mA and/or kV according to patient size and/or use of iterative reconstruction technique. CONTRAST:  65mL OMNIPAQUE IOHEXOL 300 MG/ML  SOLN COMPARISON:  CT 08/30/2019 abdomen and pelvis. Ultrasound 08/31/2017. Older exams as well FINDINGS: Lower chest: There is some linear opacity lung bases likely scar or atelectasis. No pleural effusion. Hepatobiliary: Previous cholecystectomy. Slight ectasia of the biliary tree but normal  tapering of the duct towards the pancreatic head. This is stable. No space-occupying liver lesion. Patent portal vein. Pancreas: Unremarkable. No pancreatic ductal dilatation or surrounding inflammatory changes. Spleen: Normal in size without focal abnormality. Adrenals/Urinary Tract: Adrenal glands are preserved. The left kidney has some tiny low-attenuation lesions which are too small to completely characterize though likely benign cysts. Bosniak 2 lesions. No left-sided renal collecting system dilatation. There is wall thickening of the urinary bladder. Please correlate for any evidence of cystitis. The right kidney has some areas of hazy enhancement with the adjacent stranding. Possibilities include pyelonephritis. Some of the areas are confluent and somewhat focal. For example focus laterally along the lower aspect of the right kidney on series 2, image 34 measuring 13 mm. No soft tissue gas. There is benign Bosniak 1 cyst separately in the lateral aspect of the right kidney on series 2, image 28. Bosniak 1 lesion. No follow-up of the benign cyst. There is slight ectasia of the collecting system  with some urothelial thickening. Stomach/Bowel: Moderate diffuse colonic stool. Few sigmoid colon diverticula. Normal retrocecal appendix. The stomach and small bowel are nondilated. Vascular/Lymphatic: Normal caliber aorta and IVC with scattered vascular calcifications. Circumaortic left renal vein. No specific abnormal lymph node enlargement identified in the abdomen and pelvis. Reproductive: Status post hysterectomy. No adnexal masses. Other: No abdominal wall hernia or abnormality. No abdominopelvic ascites. Musculoskeletal: Mild degenerative changes of the spine and pelvis. Spinal hemangioma at L2. IMPRESSION: Changes of pyelonephritis with heterogeneous enhancement of the right kidney, adjacent stranding. Slight ectasia of the collecting system of the kidney with urothelial thickening. Small focal area of  low-density in the inferior aspect of the right kidney. Recommend short follow-up to exclude an area of developing phlegmon. No soft tissue gas. Mild wall thickening of the urinary bladder. Diffuse colonic stool.  Normal appendix. Previous cholecystectomy with stable mild ectasia of the biliary tree Electronically Signed   By: Karen Kays M.D.   On: 06/29/2022 11:42    Assessment/Plan Principal problem Sepsis due to UTI/pyelonephritis -met sepsis criteria with high fever, leukocytosis, source.  Given history of ESBL she was started on meropenem, continue.  Blood cultures were obtained in the ED, however unfortunately she received antibiotics and I do not see any urine cultures.  Sent it now, however may be falsely negative due to already receiving antibiotics.  She has a possibly developing phlegmon in the right kidney based on the CT scan, consider reimaging if fever persists -Continue IV fluids overnight, Tylenol for fever, supportive care.  Allow regular diet  Active problems Hyponatremia -due to poor p.o. intake, fluids overnight and repeat sodium level in the morning  Hypokalemia -replace potassium, recheck in the morning  Depression-continue home medication   DVT prophylaxis: Lovenox  Code Status: Full code  Family Communication: husband at bedside  Disposition Plan: home when ready  Bed Type: Progressive Consults called: none  Obs/Inp: Inpatient  At the time of admission, it appears that the appropriate admission status for this patient is INPATIENT as it is expected that patient will require hospital care > 2 midnights. This is judged to be reasonable and necessary in order to provide the required intensity of service to ensure the patient's safety given: presenting symptoms, initial radiographic and laboratory data and in the context of their chronic comorbidities. Together, these circumstances are felt to place patient at high at high risk for further clinical deterioration  threatening life, limb, or organ.  Pamella Pert, MD, PhD [facility]  Contact via [email or web address]  06/29/2022, 5:33 PM

## 2022-06-29 NOTE — ED Notes (Signed)
Greggory Stallion at [facility] will send transport. Bed Ready 4W Room# 1425.-ABB(NS)

## 2022-06-29 NOTE — ED Notes (Signed)
Report given to the floor RN.

## 2022-06-29 NOTE — ED Notes (Signed)
Report given to [facility]. 

## 2022-06-30 DIAGNOSIS — N10 Acute tubulo-interstitial nephritis: Secondary | ICD-10-CM | POA: Diagnosis not present

## 2022-06-30 LAB — COMPREHENSIVE METABOLIC PANEL
ALT: 25 U/L (ref 0–44)
AST: 25 U/L (ref 15–41)
Albumin: 2.7 g/dL — ABNORMAL LOW (ref 3.5–5.0)
Alkaline Phosphatase: 58 U/L (ref 38–126)
Anion gap: 7 (ref 5–15)
BUN: 17 mg/dL (ref 6–20)
CO2: 23 mmol/L (ref 22–32)
Calcium: 7.8 mg/dL — ABNORMAL LOW (ref 8.9–10.3)
Chloride: 105 mmol/L (ref 98–111)
Creatinine, Ser: 0.83 mg/dL (ref 0.44–1.00)
GFR, Estimated: >60 mL/min (ref >60)
Glucose, Bld: 125 mg/dL — ABNORMAL HIGH (ref 70–99)
Potassium: 3.6 mmol/L (ref 3.5–5.1)
Sodium: 135 mmol/L (ref 135–145)
Total Bilirubin: 0.8 mg/dL (ref 0.3–1.2)
Total Protein: 5.4 g/dL — ABNORMAL LOW (ref 6.5–8.1)

## 2022-06-30 LAB — URINE CULTURE: Culture: NO GROWTH

## 2022-06-30 LAB — HIV ANTIBODY (ROUTINE TESTING W REFLEX): HIV Screen 4th Generation wRfx: NONREACTIVE

## 2022-06-30 LAB — CBC
HCT: 35.4 % — ABNORMAL LOW (ref 36.0–46.0)
Hemoglobin: 11.6 g/dL — ABNORMAL LOW (ref 12.0–15.0)
MCH: 31 pg (ref 26.0–34.0)
MCHC: 32.8 g/dL (ref 30.0–36.0)
MCV: 94.7 fL (ref 80.0–100.0)
Platelets: 108 10*3/uL — ABNORMAL LOW (ref 150–400)
RBC: 3.74 MIL/uL — ABNORMAL LOW (ref 3.87–5.11)
RDW: 12.7 % (ref 11.5–15.5)
WBC: 16.7 10*3/uL — ABNORMAL HIGH (ref 4.0–10.5)
nRBC: 0 % (ref 0.0–0.2)

## 2022-06-30 LAB — CULTURE, BLOOD (ROUTINE X 2)

## 2022-06-30 LAB — MAGNESIUM: Magnesium: 2 mg/dL (ref 1.7–2.4)

## 2022-06-30 NOTE — Progress Notes (Signed)
PROGRESS NOTE  Heather Hess  VWU:[id number] DOB: Apr 29, 1960 DOA: 06/29/2022 PCP: Teena Irani, PA-C   Brief Narrative: Patient is a 62 year old female with history of recurrent UTI with ESBL isolates presented to the emergency department with complaint of right-sided flank pain, nausea, poor oral intake, fever, chills, foul-smelling urine for the last 4 days.  On presentation she was febrile.  Lab work showed potassium of 3.3, white cell count of 22.  Urinalysis was suggestive of UTI.  CT abdomen/pelvis showed evidence of pyelonephritis in the right kidney with possible developing phlegmon.  Urine culture, blood culture sent, started on broad spectrum antibiotics.  Assessment & Plan:  Principal Problem:   Acute pyelonephritis   Sepsis secondary to UTI/pyonephritis: Met sepsis criteria, presented with high-grade fever, leukocytosis, right-sided flank pain.  History of ESBL UTI.  CT imaging showed possible right-sided phlegmon.  We will reevaluate her with CT imaging if fever and pain persist to rule out perinephric abscess. Currently on meropenem.  Follow-up urine culture, blood culture.  Continue gentle IV fluids. Leukocytosis improving.  Afebrile this morning, blood pressure still soft but stable.  Hyponatremia: Likely from poor oral intake, improving  Hypokalemia: Continue to monitor and supplement as needed  Depression: Continue home medication        DVT prophylaxis:enoxaparin (LOVENOX) injection 40 mg Start: 06/29/22 2200     Code Status: Full Code  Family Communication: Husband at bedside  Patient status:Inpatient  Patient is from :Home  Anticipated discharge GN:FAOZ  Estimated DC date: 2 to 3 days   Consultants: None  Procedures:None  Antimicrobials:  Anti-infectives (From admission, onward)    Start     Dose/Rate Route Frequency Ordered Stop   06/29/22 1300  meropenem (MERREM) 1 g in sodium chloride 0.9 % 100 mL IVPB        1 g 200 mL/hr over 30  Minutes Intravenous Every 8 hours 06/29/22 1220     06/29/22 1000  cefTRIAXone (ROCEPHIN) 1 g in sodium chloride 0.9 % 100 mL IVPB        1 g 200 mL/hr over 30 Minutes Intravenous  Once 06/29/22 0954 06/29/22 1054       Subjective: Patient seen and examined at bedside today.  Hemodynamically stable.  Afebrile this morning.  She feels better today but still has some discomfort on the right costovertebral angle.  Denies any dysuria  Objective: Vitals:   06/29/22 1959 06/30/22 0009 06/30/22 0113 06/30/22 0452  BP: (!) 90/52 (!) 106/52  (!) 92/50  Pulse: 71 85  64  Resp: 15 16  16   Temp: 98.7 F (37.1 C) (!) 102.8 F (39.3 C) 99.3 F (37.4 C) 98 F (36.7 C)  TempSrc: Oral  Oral   SpO2: 100% 99%  99%  Weight:      Height:        Intake/Output Summary (Last 24 hours) at 06/30/2022 0752 Last data filed at 06/30/2022 0600 Gross per 24 hour  Intake 3278.09 ml  Output --  Net 3278.09 ml   Filed Weights   06/29/22 0852  Weight: 59 kg    Examination:  General exam: Overall comfortable, not in distress HEENT: PERRL Respiratory system:  no wheezes or crackles  Cardiovascular system: S1 & S2 heard, RRR.  Gastrointestinal system: Abdomen is nondistended, soft and nontender.  Right costovertebral angle tenderness Central nervous system: Alert and oriented Extremities: No edema, no clubbing ,no cyanosis Skin: No rashes, no ulcers,no icterus     Data Reviewed: I have personally reviewed  following labs and imaging studies  CBC: Recent Labs  Lab 06/29/22 1018 06/30/22 0422  WBC 22.0* 16.7*  NEUTROABS 19.6*  --   HGB 13.4 11.6*  HCT 39.0 35.4*  MCV 92.2 94.7  PLT 134* 108*   Basic Metabolic Panel: Recent Labs  Lab 06/29/22 1018 06/30/22 0422  NA 133* 135  K 3.3* 3.6  CL 96* 105  CO2 27 23  GLUCOSE 130* 125*  BUN 14 17  CREATININE 0.82 0.83  CALCIUM 8.4* 7.8*  MG  --  2.0     No results found for this or any previous visit (from the past 240 hour(s)).    Radiology Studies: CT ABDOMEN PELVIS W CONTRAST  Result Date: 06/29/2022 CLINICAL DATA:  History of abscess. Prior hysterectomy and cholecystectomy with right flank pain for 4 days. Hematuria EXAM: CT ABDOMEN AND PELVIS WITH CONTRAST TECHNIQUE: Multidetector CT imaging of the abdomen and pelvis was performed using the standard protocol following bolus administration of intravenous contrast. RADIATION DOSE REDUCTION: This exam was performed according to the departmental dose-optimization program which includes automated exposure control, adjustment of the mA and/or kV according to patient size and/or use of iterative reconstruction technique. CONTRAST:  65mL OMNIPAQUE IOHEXOL 300 MG/ML  SOLN COMPARISON:  CT 08/30/2019 abdomen and pelvis. Ultrasound 08/31/2017. Older exams as well FINDINGS: Lower chest: There is some linear opacity lung bases likely scar or atelectasis. No pleural effusion. Hepatobiliary: Previous cholecystectomy. Slight ectasia of the biliary tree but normal tapering of the duct towards the pancreatic head. This is stable. No space-occupying liver lesion. Patent portal vein. Pancreas: Unremarkable. No pancreatic ductal dilatation or surrounding inflammatory changes. Spleen: Normal in size without focal abnormality. Adrenals/Urinary Tract: Adrenal glands are preserved. The left kidney has some tiny low-attenuation lesions which are too small to completely characterize though likely benign cysts. Bosniak 2 lesions. No left-sided renal collecting system dilatation. There is wall thickening of the urinary bladder. Please correlate for any evidence of cystitis. The right kidney has some areas of hazy enhancement with the adjacent stranding. Possibilities include pyelonephritis. Some of the areas are confluent and somewhat focal. For example focus laterally along the lower aspect of the right kidney on series 2, image 34 measuring 13 mm. No soft tissue gas. There is benign Bosniak 1 cyst separately  in the lateral aspect of the right kidney on series 2, image 28. Bosniak 1 lesion. No follow-up of the benign cyst. There is slight ectasia of the collecting system with some urothelial thickening. Stomach/Bowel: Moderate diffuse colonic stool. Few sigmoid colon diverticula. Normal retrocecal appendix. The stomach and small bowel are nondilated. Vascular/Lymphatic: Normal caliber aorta and IVC with scattered vascular calcifications. Circumaortic left renal vein. No specific abnormal lymph node enlargement identified in the abdomen and pelvis. Reproductive: Status post hysterectomy. No adnexal masses. Other: No abdominal wall hernia or abnormality. No abdominopelvic ascites. Musculoskeletal: Mild degenerative changes of the spine and pelvis. Spinal hemangioma at L2. IMPRESSION: Changes of pyelonephritis with heterogeneous enhancement of the right kidney, adjacent stranding. Slight ectasia of the collecting system of the kidney with urothelial thickening. Small focal area of low-density in the inferior aspect of the right kidney. Recommend short follow-up to exclude an area of developing phlegmon. No soft tissue gas. Mild wall thickening of the urinary bladder. Diffuse colonic stool.  Normal appendix. Previous cholecystectomy with stable mild ectasia of the biliary tree Electronically Signed   By: Karen Kays M.D.   On: 06/29/2022 11:42    Scheduled Meds:  DULoxetine  60 mg Oral BID   enoxaparin (LOVENOX) injection  40 mg Subcutaneous Q24H   Continuous Infusions:  sodium chloride 75 mL/hr at 06/29/22 1853   meropenem (MERREM) IV 1 g (06/30/22 0503)     LOS: 1 day   Burnadette Pop, MD [facility] P5/03/2022, 7:52 AM

## 2022-06-30 NOTE — TOC CM/SW Note (Signed)
  Transition of Care [facility]) Screening Note   Patient Details  Name: Heather Hess Date of Birth: 08-19-1960   Transition of Care [facility]) CM/SW Contact:    Howell Rucks, RN Phone Number: 06/30/2022, 8:36 AM    Transition of Care Department [facility]) has reviewed patient and no TOC needs have been identified at this time. We will continue to monitor patient advancement through interdisciplinary progression rounds. If new patient transition needs arise, please place a TOC consult.

## 2022-07-01 ENCOUNTER — Inpatient Hospital Stay (HOSPITAL_COMMUNITY): Payer: No Typology Code available for payment source

## 2022-07-01 DIAGNOSIS — N10 Acute tubulo-interstitial nephritis: Secondary | ICD-10-CM | POA: Diagnosis not present

## 2022-07-01 LAB — BASIC METABOLIC PANEL
Anion gap: 5 (ref 5–15)
BUN: 11 mg/dL (ref 6–20)
CO2: 25 mmol/L (ref 22–32)
Calcium: 7.5 mg/dL — ABNORMAL LOW (ref 8.9–10.3)
Chloride: 104 mmol/L (ref 98–111)
Creatinine, Ser: 0.74 mg/dL (ref 0.44–1.00)
GFR, Estimated: >60 mL/min (ref >60)
Glucose, Bld: 114 mg/dL — ABNORMAL HIGH (ref 70–99)
Potassium: 3.9 mmol/L (ref 3.5–5.1)
Sodium: 134 mmol/L — ABNORMAL LOW (ref 135–145)

## 2022-07-01 LAB — CBC
HCT: 34.2 % — ABNORMAL LOW (ref 36.0–46.0)
Hemoglobin: 10.9 g/dL — ABNORMAL LOW (ref 12.0–15.0)
MCH: 30.5 pg (ref 26.0–34.0)
MCHC: 31.9 g/dL (ref 30.0–36.0)
MCV: 95.8 fL (ref 80.0–100.0)
Platelets: 113 10*3/uL — ABNORMAL LOW (ref 150–400)
RBC: 3.57 MIL/uL — ABNORMAL LOW (ref 3.87–5.11)
RDW: 12.7 % (ref 11.5–15.5)
WBC: 12.1 10*3/uL — ABNORMAL HIGH (ref 4.0–10.5)
nRBC: 0 % (ref 0.0–0.2)

## 2022-07-01 MED ORDER — IOHEXOL 9 MG/ML PO SOLN
500.0000 mL | ORAL | Status: AC
  Administered 2022-07-01 (×2): 500 mL via ORAL

## 2022-07-01 MED ORDER — IOHEXOL 300 MG/ML  SOLN
80.0000 mL | Freq: Once | INTRAMUSCULAR | Status: AC | PRN
  Administered 2022-07-01: 80 mL via INTRAVENOUS

## 2022-07-01 MED ORDER — SODIUM CHLORIDE (PF) 0.9 % IJ SOLN
INTRAMUSCULAR | Status: AC
  Filled 2022-07-01: qty 50

## 2022-07-01 MED ORDER — IOHEXOL 9 MG/ML PO SOLN
ORAL | Status: AC
  Filled 2022-07-01: qty 1000

## 2022-07-01 NOTE — Progress Notes (Signed)
PHARMACY NOTE -  meropenem  Pharmacy has been assisting with dosing of meropenem for UTI. Dosage remains stable at 1gm q8h and need for further dosage adjustment appears unlikely at present.    Will sign off at this time.  Please reconsult if a change in clinical status warrants re-evaluation of dosage.  Dorna Leitz, PharmD, BCPS 07/01/2022 10:50 AM

## 2022-07-01 NOTE — Progress Notes (Addendum)
PROGRESS NOTE  Heather Hess  ZOX:[id number] DOB: [birth year] DOA: 06/29/2022 PCP: Teena Irani, PA-C   Brief Narrative: Patient is a 62 year old female with history of recurrent UTI with ESBL isolates presented to the emergency department with complaint of right-sided flank pain, nausea, poor oral intake, fever, chills, foul-smelling urine for the last 4 days.  On presentation she was febrile.  Lab work showed potassium of 3.3, white cell count of 22.  Urinalysis was suggestive of UTI.  CT abdomen/pelvis showed evidence of pyelonephritis in the right kidney with possible developing phlegmon.  Urine culture, blood culture sent, started on broad spectrum antibiotics.  Assessment & Plan:  Principal Problem:   Acute pyelonephritis   Sepsis secondary to UTI/pyonephritis:Met sepsis criteria, presented with high-grade fever, leukocytosis, right-sided flank pain.  History of ESBL UTI.  CT imaging features of pyelonephritis.   Started on meropenem. Leukocytosis improving.  Afebrile for last 48 hours.  Urine culture did not show any growth.  Blood cultures have remained negative so far.  She still has some discomfort on the right flank though. Follow-up CT scan showed 1.2 cm abscess,IR consulted  Hyponatremia: Likely from poor oral intake, improving  Hypokalemia: Continue to monitor and supplement as needed  Depression: Continue home medication        DVT prophylaxis:enoxaparin (LOVENOX) injection 40 mg Start: 06/29/22 2200     Code Status: Full Code  Family Communication: Husband at bedside  Patient status:Inpatient  Patient is from :Home  Anticipated discharge WJ:XBJY  Estimated DC date: 2 to 3 days   Consultants: IR  Procedures:None  Antimicrobials:  Anti-infectives (From admission, onward)    Start     Dose/Rate Route Frequency Ordered Stop   06/29/22 1300  meropenem (MERREM) 1 g in sodium chloride 0.9 % 100 mL IVPB        1 g 200 mL/hr over 30 Minutes Intravenous  Every 8 hours 06/29/22 1220     06/29/22 1000  cefTRIAXone (ROCEPHIN) 1 g in sodium chloride 0.9 % 100 mL IVPB        1 g 200 mL/hr over 30 Minutes Intravenous  Once 06/29/22 0954 06/29/22 1054       Subjective: Patient seen and examined at bedside today.  She feels better today.  But she is still having right flank pain.  Afebrile.  Blood pressure soft but stable anastomotic  Objective: Vitals:   06/30/22 1351 06/30/22 2013 07/01/22 0535 07/01/22 1304  BP: (!) 96/52 (!) 92/53 (!) 100/57 99/61  Pulse: 67 73 61 63  Resp: 20 16 16 20   Temp: 98.2 F (36.8 C) 99.2 F (37.3 C)  98.3 F (36.8 C)  TempSrc: Oral Oral  Oral  SpO2: 100% 100% 98% 100%  Weight:      Height:        Intake/Output Summary (Last 24 hours) at 07/01/2022 1650 Last data filed at 07/01/2022 0700 Gross per 24 hour  Intake 2844.16 ml  Output 0 ml  Net 2844.16 ml   Filed Weights   06/29/22 0852  Weight: 59 kg    Examination:  General exam: Overall comfortable, not in distress HEENT: PERRL Respiratory system:  no wheezes or crackles  Cardiovascular system: S1 & S2 heard, RRR.  Gastrointestinal system: Abdomen is nondistended, soft and nontender.  Right costo vertebral angle  tenderness Central nervous system: Alert and oriented Extremities: No edema, no clubbing ,no cyanosis Skin: No rashes, no ulcers,no icterus       Data Reviewed: I have personally reviewed  following labs and imaging studies  CBC: Recent Labs  Lab 06/29/22 1018 06/30/22 0422 07/01/22 0424  WBC 22.0* 16.7* 12.1*  NEUTROABS 19.6*  --   --   HGB 13.4 11.6* 10.9*  HCT 39.0 35.4* 34.2*  MCV 92.2 94.7 95.8  PLT 134* 108* 113*   Basic Metabolic Panel: Recent Labs  Lab 06/29/22 1018 06/30/22 0422 07/01/22 0424  NA 133* 135 134*  K 3.3* 3.6 3.9  CL 96* 105 104  CO2 27 23 25   GLUCOSE 130* 125* 114*  BUN 14 17 11   CREATININE 0.82 0.83 0.74  CALCIUM 8.4* 7.8* 7.5*  MG  --  2.0  --      Recent Results (from the past 240  hour(s))  Blood culture (routine x 2)     Status: None (Preliminary result)   Collection Time: 06/29/22 12:30 PM   Specimen: BLOOD  Result Value Ref Range Status   Specimen Description   Final    BLOOD LEFT ANTECUBITAL Performed at [facility], [address] [place], [place] [address]    Special Requests   Final    BOTTLES DRAWN AEROBIC AND ANAEROBIC Blood Culture adequate volume Performed at [facility], [address] [place], [place] [address]    Culture   Final    NO GROWTH 2 DAYS Performed at [facility], [address], [place], [place] [address]    Report Status PENDING  Incomplete  Blood culture (routine x 2)     Status: None (Preliminary result)   Collection Time: 06/29/22 12:40 PM   Specimen: BLOOD  Result Value Ref Range Status   Specimen Description   Final    BLOOD RIGHT ANTECUBITAL Performed at [facility], [address], [place], [place] [address]    Special Requests   Final    BOTTLES DRAWN AEROBIC AND ANAEROBIC Blood Culture adequate volume Performed at [facility], [address] [place], [place] [address]    Culture   Final    NO GROWTH 2 DAYS Performed at [facility], [address], [place], [place] [address]    Report Status PENDING  Incomplete  Urine Culture (for pregnant, neutropenic or urologic patients or patients with an indwelling urinary catheter)     Status: None   Collection Time: 06/29/22  5:13 PM   Specimen: Urine, Clean Catch  Result Value Ref Range Status   Specimen Description   Final    URINE, CLEAN CATCH Performed at [facility], [address], [place], [place] [address]    Special Requests   Final    NONE Performed at [facility], [address], [place], [place] [address]    Culture   Final    NO GROWTH Performed at [facility], [address], [place], [place] [address]     Report Status 06/30/2022 FINAL  Final     Radiology Studies: CT ABDOMEN PELVIS W CONTRAST  Result Date: 07/01/2022 CLINICAL DATA:  Kidney infection. Patient has been on antibiotics with some improvement. Still with flank pain. EXAM: CT ABDOMEN AND PELVIS WITH CONTRAST TECHNIQUE: Multidetector CT imaging of the abdomen and pelvis was performed using the standard protocol following bolus administration of intravenous contrast. RADIATION DOSE REDUCTION: This exam was performed according to the departmental dose-optimization program which includes automated exposure control, adjustment of the mA and/or kV according to patient size and/or use of iterative reconstruction technique. CONTRAST:  80mL OMNIPAQUE IOHEXOL  300 MG/ML  SOLN COMPARISON:  06/29/2022. FINDINGS: Lower chest: Trace pleural effusions. Mild dependent lower lobe atelectasis. Hepatobiliary: Liver mildly enlarged, 24 cm transversely. Normal liver attenuation. No mass. Status post cholecystectomy. Mild intrahepatic bile duct dilation. Common bile duct measures 8 mm proximally, with distal tapering. Pancreas: Unremarkable. No pancreatic ductal dilatation or surrounding inflammatory changes. Spleen: Normal in size without focal abnormality. Adrenals/Urinary Tract: No adrenal mass. There are areas of decreased attenuation noted in the right kidney, largest extending from the mid to the upper pole, smaller areas in the lower pole. These findings are without significant change from the prior CT. There is a well-defined hypoattenuating mass in the right kidney at the midpole, 1.9 cm in size, consistent with a cyst with no follow-up rectum. Small area of fluid attenuation noted in the upper pole the right kidney, new since the prior exam, 1.2 cm in size, suspected to be a small renal abscess. No other evidence of a renal abscess. Mild right perinephric stranding/edema extending along the posterior pararenal fascia into the pelvis. No perinephric abscess. Normal  appearance of the left kidney. No renal stones on either side. Mild dilation of the right renal pelvis and portions of the right ureter. Normal left renal collecting system and ureter. Bladder unremarkable. Stomach/Bowel: Normal stomach. Small bowel and colon are normal in caliber. No wall thickening. No inflammation. Moderate increase in the colonic stool burden. Vascular/Lymphatic: Aortic atherosclerosis. No aneurysm. No enlarged lymph nodes. Reproductive: Status post hysterectomy. No adnexal masses. Other: Trace ascites. Musculoskeletal: No fracture or acute finding. No aggressive bone lesion. IMPRESSION: 1. Multifocal right sided pyelonephritis. Small, 1.2 cm, renal abscess suspected from the lateral upper pole the right kidney. This is new since the prior exam. Overall extent of the pyelonephritis, however, is unchanged. No perinephric abscess. 2. No other acute abnormality. Normal appearance of the left kidney. 3. Moderate increase in the colonic stool burden, similar to the prior CT. 4. Aortic atherosclerosis. Electronically Signed   By: Amie Portland M.D.   On: 07/01/2022 16:45    Scheduled Meds:  DULoxetine  60 mg Oral BID   enoxaparin (LOVENOX) injection  40 mg Subcutaneous Q24H   Continuous Infusions:  sodium chloride 100 mL/hr (07/01/22 0742)   meropenem (MERREM) IV 1 g (07/01/22 1311)     LOS: 2 days   Burnadette Pop, MD [facility] P5/04/2022, 4:50 PM

## 2022-07-01 NOTE — Discharge Summary (Incomplete)
Physician Discharge Summary  Heather Hess ZOX:[id number] DOB: 1960/08/29 DOA: 06/29/2022  PCP: Teena Irani, PA-C  Admit date: 06/29/2022 Discharge date: 07/02/2022  Admitted From: Home Disposition:  Home  Discharge Condition:Stable CODE STATUS:FULL Diet recommendation:  Regular   Brief/Interim Summary: Patient is a 62 year old female with history of recurrent UTI with ESBL isolates presented to the emergency department with complaint of right-sided flank pain, nausea, poor oral intake, fever, chills, foul-smelling urine for the last 4 days.  On presentation she was febrile.  Lab work showed potassium of 3.3, white cell count of 22.  Urinalysis was suggestive of UTI.  CT abdomen/pelvis showed evidence of pyelonephritis in the right kidney with possible developing phlegmon.  Urine culture, blood culture sent, started on broad spectrum antibiotics.  Urine culture did not show any growth.  Blood cultures have remained negative so far.  Patient has been afebrile since admission.  Follow-up CT scan showed persistent pyelonephritis changes, 1.2 cm abscess.  IR/urology consulted.  Recommended antibiotic treatment, abscess was too small for drainage.  She has clinically improved and feels better today to be discharged to home with oral antibiotics.  She will [facility]up with [facility] as [facility]   Following problems were addressed during the hospitalization:   Sepsis secondary to UTI/pyonephritis: Met sepsis criteria, presented with high-grade fever, leukocytosis, right-sided flank pain.  History of ESBL UTI.  CT imaging features of pyelonephritis.   Started on meropenem. Leukocytosis improving.  Afebrile for last 48 hours.  Urine culture did not show any growth.  Blood cultures have remained negative so far.   Follow-up CT scan showed persistent pyelonephritis changes, 1.2 cm abscess.  IR/urology consulted.  Recommended antibiotic treatment, abscess was too small for drainage.  She has  clinically improved and feels better today to be discharged to home with oral antibiotics.  She will [facility]-up with [facility] as [facility]. Will continue antibiotic for total of 14 days, prescribed 10 days course of ciprofloxacin.  Hyponatremia: Likely from poor oral intake, improved   Hypokalemia: Supplemented    Depression: Continue home medication    Discharge Diagnoses:  Principal Problem:   Acute pyelonephritis    Discharge Instructions  Discharge Instructions     Diet general   Complete by: As directed    Discharge instructions   Complete by: As directed    1)Please take prescribed medications as instructed 2)Follow up with your PCP in a week.Do  a CT scan of your right kidney for follow-up exam in 2 weeks. 3)Please follow-up with urology as an outpatient in a week.  Name and number the provider group has been attached   Increase activity slowly   Complete by: As directed       Allergies as of 07/02/2022   No Known Allergies      Medication List     TAKE these medications    ciprofloxacin 500 MG tablet Commonly known as: Cipro Take 1 tablet (500 mg total) by mouth 2 (two) times daily for 10 days.   DULoxetine 60 MG capsule Commonly known as: CYMBALTA Take 1 capsule (60 mg total) by mouth 2 (two) times daily.   HYDROcodone-acetaminophen 5-325 MG tablet Commonly known as: NORCO/VICODIN Take 1-2 tablets by mouth every 4 (four) hours as needed for moderate pain.   potassium chloride SA 20 MEQ tablet Commonly known as: KLOR-CON M Take 2 tablets (40 mEq total) by mouth daily for 3 days. Start taking on: Jul 03, 2022   VITAMIN C PO Take  1 tablet by mouth at bedtime.        Follow-up Information     Teena Irani, PA-C. Schedule an appointment as soon as possible for a visit in 1 week(s).   Specialty: Physician Assistant Contact information: [address] [place] [address] [phone]         [facility] Follow up.   Why: Please contact the office to arrange a follow appointment with one of our nurse practitioners or PA for 2-3 weeks from discharge. Contact information: [address] [place] [address] [phone]               No Known Allergies  Consultations: Urology IR   Procedures/Studies: CT ABDOMEN PELVIS W CONTRAST  Result Date: 07/01/2022 CLINICAL DATA:  Kidney infection. Patient has been on antibiotics with some improvement. Still with flank pain. EXAM: CT ABDOMEN AND PELVIS WITH CONTRAST TECHNIQUE: Multidetector CT imaging of the abdomen and pelvis was performed using the standard protocol following bolus administration of intravenous contrast. RADIATION DOSE REDUCTION: This exam was performed according to the departmental dose-optimization program which includes automated exposure control, adjustment of the mA and/or kV according to patient size and/or use of iterative reconstruction technique. CONTRAST:  80mL OMNIPAQUE IOHEXOL 300 MG/ML  SOLN COMPARISON:  06/29/2022. FINDINGS: Lower chest: Trace pleural effusions. Mild dependent lower lobe atelectasis. Hepatobiliary: Liver mildly enlarged, 24 cm transversely. Normal liver attenuation. No mass. Status post cholecystectomy. Mild intrahepatic bile duct dilation. Common bile duct measures 8 mm proximally, with distal tapering. Pancreas: Unremarkable. No pancreatic ductal dilatation or surrounding inflammatory changes. Spleen: Normal in size without focal abnormality. Adrenals/Urinary Tract: No adrenal mass. There are areas of decreased attenuation noted in the right kidney, largest extending from the mid to the upper pole, smaller areas in the lower pole. These findings are without significant change from the prior CT. There is a well-defined hypoattenuating mass in the right kidney at the midpole, 1.9 cm in size, consistent with a cyst with no follow-up rectum. Small area of fluid attenuation noted in  the upper pole the right kidney, new since the prior exam, 1.2 cm in size, suspected to be a small renal abscess. No other evidence of a renal abscess. Mild right perinephric stranding/edema extending along the posterior pararenal fascia into the pelvis. No perinephric abscess. Normal appearance of the left kidney. No renal stones on either side. Mild dilation of the right renal pelvis and portions of the right ureter. Normal left renal collecting system and ureter. Bladder unremarkable. Stomach/Bowel: Normal stomach. Small bowel and colon are normal in caliber. No wall thickening. No inflammation. Moderate increase in the colonic stool burden. Vascular/Lymphatic: Aortic atherosclerosis. No aneurysm. No enlarged lymph nodes. Reproductive: Status post hysterectomy. No adnexal masses. Other: Trace ascites. Musculoskeletal: No fracture or acute finding. No aggressive bone lesion. IMPRESSION: 1. Multifocal right sided pyelonephritis. Small, 1.2 cm, renal abscess suspected from the lateral upper pole the right kidney. This is new since the prior exam. Overall extent of the pyelonephritis, however, is unchanged. No perinephric abscess. 2. No other acute abnormality. Normal appearance of the left kidney. 3. Moderate increase in the colonic stool burden, similar to the prior CT. 4. Aortic atherosclerosis. Electronically Signed   By: Amie [place] M.D.   On: 07/01/2022 16:45   CT ABDOMEN PELVIS W CONTRAST  Result Date: 06/29/2022 CLINICAL DATA:  History of abscess. Prior hysterectomy and cholecystectomy with right flank pain for 4 days. Hematuria  EXAM: CT ABDOMEN AND PELVIS WITH CONTRAST TECHNIQUE: Multidetector CT imaging of the abdomen and pelvis was performed using the standard protocol following bolus administration of intravenous contrast. RADIATION DOSE REDUCTION: This exam was performed according to the departmental dose-optimization program which includes automated exposure control, adjustment of the mA and/or kV  according to patient size and/or use of iterative reconstruction technique. CONTRAST:  65mL OMNIPAQUE IOHEXOL 300 MG/ML  SOLN COMPARISON:  CT 08/30/2019 abdomen and pelvis. Ultrasound 08/31/2017. Older exams as well FINDINGS: Lower chest: There is some linear opacity lung bases likely scar or atelectasis. No pleural effusion. Hepatobiliary: Previous cholecystectomy. Slight ectasia of the biliary tree but normal tapering of the duct towards the pancreatic head. This is stable. No space-occupying liver lesion. Patent portal vein. Pancreas: Unremarkable. No pancreatic ductal dilatation or surrounding inflammatory changes. Spleen: Normal in size without focal abnormality. Adrenals/Urinary Tract: Adrenal glands are preserved. The left kidney has some tiny low-attenuation lesions which are too small to completely characterize though likely benign cysts. Bosniak 2 lesions. No left-sided renal collecting system dilatation. There is wall thickening of the urinary bladder. Please correlate for any evidence of cystitis. The right kidney has some areas of hazy enhancement with the adjacent stranding. Possibilities include pyelonephritis. Some of the areas are confluent and somewhat focal. For example focus laterally along the lower aspect of the right kidney on series 2, image 34 measuring 13 mm. No soft tissue gas. There is benign Bosniak 1 cyst separately in the lateral aspect of the right kidney on series 2, image 28. Bosniak 1 lesion. No follow-up of the benign cyst. There is slight ectasia of the collecting system with some urothelial thickening. Stomach/Bowel: Moderate diffuse colonic stool. Few sigmoid colon diverticula. Normal retrocecal appendix. The stomach and small bowel are nondilated. Vascular/Lymphatic: Normal caliber aorta and IVC with scattered vascular calcifications. Circumaortic left renal vein. No specific abnormal lymph node enlargement identified in the abdomen and pelvis. Reproductive: Status post  hysterectomy. No adnexal masses. Other: No abdominal wall hernia or abnormality. No abdominopelvic ascites. Musculoskeletal: Mild degenerative changes of the spine and pelvis. Spinal hemangioma at L2. IMPRESSION: Changes of pyelonephritis with heterogeneous enhancement of the right kidney, adjacent stranding. Slight ectasia of the collecting system of the kidney with urothelial thickening. Small focal area of low-density in the inferior aspect of the right kidney. Recommend short follow-up to exclude an area of developing phlegmon. No soft tissue gas. Mild wall thickening of the urinary bladder. Diffuse colonic stool.  Normal appendix. Previous cholecystectomy with stable mild ectasia of the biliary tree Electronically Signed   By: Karen Kays M.D.   On: 06/29/2022 11:42      Subjective: Patient seen and the bedside today.  Hemodynamically stable.  Afebrile.  Feels much better today.  He still has some right flank discomfort but she really wants to go home  Discharge Exam: Vitals:   07/01/22 2028 07/02/22 0448  BP: (!) 89/55 118/67  Pulse: 60 60  Resp: 19 19  Temp: 98.4 F (36.9 C) 97.9 F (36.6 C)  SpO2: 98% 99%   Vitals:   07/01/22 0535 07/01/22 1304 07/01/22 2028 07/02/22 0448  BP: (!) 100/57 99/61 (!) 89/55 118/67  Pulse: 61 63 60 60  Resp: 16 20 19 19   Temp:  98.3 F (36.8 C) 98.4 F (36.9 C) 97.9 F (36.6 C)  TempSrc:  Oral    SpO2: 98% 100% 98% 99%  Weight:      Height:  General: Pt is alert, awake, not in acute distress Cardiovascular: RRR, S1/S2 +, no rubs, no gallops Respiratory: CTA bilaterally, no wheezing, no rhonchi Abdominal: Soft, NT, ND, bowel sounds + Extremities: no edema, no cyanosis    The results of significant diagnostics from this hospitalization (including imaging, microbiology, ancillary and laboratory) are listed below for reference.     Microbiology: Recent Results (from the past 240 hour(s))  Blood culture (routine x 2)     Status:  None (Preliminary result)   Collection Time: 06/29/22 12:30 PM   Specimen: BLOOD  Result Value Ref Range Status   Specimen Description   Final    BLOOD LEFT ANTECUBITAL Performed at [facility], [address], [place], [place] [address]    Special Requests   Final    BOTTLES DRAWN AEROBIC AND ANAEROBIC Blood Culture adequate volume Performed at [facility], [address], [place], [place] [address]    Culture   Final    NO GROWTH 3 DAYS Performed at [facility], [address] [place], [place] [address]    Report Status PENDING  Incomplete  Blood culture (routine x 2)     Status: None (Preliminary result)   Collection Time: 06/29/22 12:40 PM   Specimen: BLOOD  Result Value Ref Range Status   Specimen Description   Final    BLOOD RIGHT ANTECUBITAL Performed at [facility], [address], [place], [place] [address]    Special Requests   Final    BOTTLES DRAWN AEROBIC AND ANAEROBIC Blood Culture adequate volume Performed at [facility], [address] [place], [place] [address]    Culture   Final    NO GROWTH 3 DAYS Performed at [facility], [address] [place], [place] [address]    Report Status PENDING  Incomplete  Urine Culture (for pregnant, neutropenic or urologic patients or patients with an indwelling urinary catheter)     Status: None   Collection Time: 06/29/22  5:13 PM   Specimen: Urine, Clean Catch  Result Value Ref Range Status   Specimen Description   Final    URINE, CLEAN CATCH Performed at [facility], [address] [place], [place] [address]    Special Requests   Final    NONE Performed at [facility], [address], [place], [place] [address]    Culture   Final    NO GROWTH Performed at [facility], [address], [place], [place] [address]    Report Status 06/30/2022 FINAL  Final     Labs: BNP  (last 3 results) No results for input(s): "BNP" in the last 8760 hours. Basic Metabolic Panel: Recent Labs  Lab 06/29/22 1018 06/30/22 0422 07/01/22 0424 07/02/22 0516  NA 133* 135 134* 139  K 3.3* 3.6 3.9 3.3*  CL 96* 105 104 105  CO2 27 23 25 27   GLUCOSE 130* 125* 114* 118*  BUN 14 17 11 7   CREATININE 0.82 0.83 0.74 0.64  CALCIUM 8.4* 7.8* 7.5* 7.7*  MG  --  2.0  --   --    Liver Function Tests: Recent Labs  Lab 06/29/22 1018 06/30/22 0422  AST 32 25  ALT 30 25  ALKPHOS 73 58  BILITOT 0.7 0.8  PROT 6.6 5.4*  ALBUMIN 3.8 2.7*   No results for input(s): "LIPASE", "AMYLASE" in the last 168 hours. No results for input(s): "AMMONIA" in the last 168 hours. CBC:  Recent Labs  Lab 06/29/22 1018 06/30/22 0422 07/01/22 0424 07/02/22 0516  WBC 22.0* 16.7* 12.1* 7.3  NEUTROABS 19.6*  --   --   --   HGB 13.4 11.6* 10.9* 10.3*  HCT 39.0 35.4* 34.2* 31.8*  MCV 92.2 94.7 95.8 93.8  PLT 134* 108* 113* 117*   Cardiac Enzymes: No results for input(s): "CKTOTAL", "CKMB", "CKMBINDEX", "TROPONINI" in the last 168 hours. BNP: Invalid input(s): "POCBNP" CBG: No results for input(s): "GLUCAP" in the last 168 hours. D-Dimer No results for input(s): "DDIMER" in the last 72 hours. Hgb A1c No results for input(s): "HGBA1C" in the last 72 hours. Lipid Profile No results for input(s): "CHOL", "HDL", "LDLCALC", "TRIG", "CHOLHDL", "LDLDIRECT" in the last 72 hours. Thyroid function studies No results for input(s): "TSH", "T4TOTAL", "T3FREE", "THYROIDAB" in the last 72 hours.  Invalid input(s): "FREET3" Anemia work up No results for input(s): "VITAMINB12", "FOLATE", "FERRITIN", "TIBC", "IRON", "RETICCTPCT" in the last 72 hours. Urinalysis    Component Value Date/Time   COLORURINE YELLOW 06/29/2022 0857   APPEARANCEUR HAZY (A) 06/29/2022 0857   LABSPEC 1.016 06/29/2022 0857   PHURINE 5.5 06/29/2022 0857   GLUCOSEU NEGATIVE 06/29/2022 0857   HGBUR SMALL (A) 06/29/2022 0857    BILIRUBINUR NEGATIVE 06/29/2022 0857   KETONESUR 15 (A) 06/29/2022 0857   PROTEINUR 100 (A) 06/29/2022 0857   UROBILINOGEN 1.0 01/08/2015 0030   NITRITE NEGATIVE 06/29/2022 0857   LEUKOCYTESUR LARGE (A) 06/29/2022 0857   Sepsis Labs Recent Labs  Lab 06/29/22 1018 06/30/22 0422 07/01/22 0424 07/02/22 0516  WBC 22.0* 16.7* 12.1* 7.3   Microbiology Recent Results (from the past 240 hour(s))  Blood culture (routine x 2)     Status: None (Preliminary result)   Collection Time: 06/29/22 12:30 PM   Specimen: BLOOD  Result Value Ref Range Status   Specimen Description   Final    BLOOD LEFT ANTECUBITAL Performed at [facility], [address], [place], [place] [address]    Special Requests   Final    BOTTLES DRAWN AEROBIC AND ANAEROBIC Blood Culture adequate volume Performed at [facility], [address], [place], [place] [address]    Culture   Final    NO GROWTH 3 DAYS Performed at [facility], [address] [place], [place] [address]    Report Status PENDING  Incomplete  Blood culture (routine x 2)     Status: None (Preliminary result)   Collection Time: 06/29/22 12:40 PM   Specimen: BLOOD  Result Value Ref Range Status   Specimen Description   Final    BLOOD RIGHT ANTECUBITAL Performed at [facility], [address], [place], [place] [address]    Special Requests   Final    BOTTLES DRAWN AEROBIC AND ANAEROBIC Blood Culture adequate volume Performed at [facility], [address] [place], [place] [address]    Culture   Final    NO GROWTH 3 DAYS Performed at [facility], [address], [place], [place] [address]    Report Status PENDING  Incomplete  Urine Culture (for pregnant, neutropenic or urologic patients or patients with an indwelling urinary catheter)     Status: None   Collection Time: 06/29/22  5:13 PM   Specimen: Urine, Clean Catch  Result Value Ref Range  Status   Specimen Description   Final    URINE, CLEAN CATCH Performed at [facility], [address] [place], [place] [address]    Special  Requests   Final    NONE Performed at [facility], [address], [place], [place] [address]    Culture   Final    NO GROWTH Performed at [facility], [address] [place], [place] [address]    Report Status 06/30/2022 FINAL  Final    Please note: You were cared for by a hospitalist during your hospital stay. Once you are discharged, your primary care physician will handle any further medical issues. Please note that NO REFILLS for any discharge medications will be authorized once you are discharged, as it is imperative that you return to your primary care physician (or establish a relationship with a primary care physician if you do not have one) for your post hospital discharge needs so that they can reassess your need for medications and monitor your lab values.    Time coordinating discharge: 40 minutes  SIGNED:   Burnadette Pop, MD  [facility] 07/02/2022, 11:58 AM Pager [phone]  If 7PM-7AM, please contact night-coverage [email or web address] Password [email or web address]

## 2022-07-02 DIAGNOSIS — N10 Acute tubulo-interstitial nephritis: Secondary | ICD-10-CM | POA: Diagnosis not present

## 2022-07-02 LAB — BASIC METABOLIC PANEL
Anion gap: 7 (ref 5–15)
BUN: 7 mg/dL (ref 6–20)
CO2: 27 mmol/L (ref 22–32)
Calcium: 7.7 mg/dL — ABNORMAL LOW (ref 8.9–10.3)
Chloride: 105 mmol/L (ref 98–111)
Creatinine, Ser: 0.64 mg/dL (ref 0.44–1.00)
GFR, Estimated: >60 mL/min (ref >60)
Glucose, Bld: 118 mg/dL — ABNORMAL HIGH (ref 70–99)
Potassium: 3.3 mmol/L — ABNORMAL LOW (ref 3.5–5.1)
Sodium: 139 mmol/L (ref 135–145)

## 2022-07-02 LAB — CBC
HCT: 31.8 % — ABNORMAL LOW (ref 36.0–46.0)
Hemoglobin: 10.3 g/dL — ABNORMAL LOW (ref 12.0–15.0)
MCH: 30.4 pg (ref 26.0–34.0)
MCHC: 32.4 g/dL (ref 30.0–36.0)
MCV: 93.8 fL (ref 80.0–100.0)
Platelets: 117 10*3/uL — ABNORMAL LOW (ref 150–400)
RBC: 3.39 MIL/uL — ABNORMAL LOW (ref 3.87–5.11)
RDW: 12.7 % (ref 11.5–15.5)
WBC: 7.3 10*3/uL (ref 4.0–10.5)
nRBC: 0 % (ref 0.0–0.2)

## 2022-07-02 LAB — CULTURE, BLOOD (ROUTINE X 2): Culture: NO GROWTH

## 2022-07-02 MED ORDER — CIPROFLOXACIN HCL 500 MG PO TABS: 500.0000 mg | ORAL_TABLET | Freq: Two times a day (BID) | ORAL | 0 refills | Status: AC

## 2022-07-02 MED ORDER — HYDROCODONE-ACETAMINOPHEN 5-325 MG PO TABS: 1.0000 | ORAL_TABLET | ORAL | 0 refills | Status: AC | PRN

## 2022-07-02 MED ORDER — POTASSIUM CHLORIDE CRYS ER 20 MEQ PO TBCR: 40.0000 meq | EXTENDED_RELEASE_TABLET | Freq: Every day | ORAL | 0 refills | Status: AC

## 2022-07-02 MED ORDER — POTASSIUM CHLORIDE CRYS ER 20 MEQ PO TBCR
40.0000 meq | EXTENDED_RELEASE_TABLET | Freq: Once | ORAL | Status: AC
  Administered 2022-07-02: 40 meq via ORAL
  Filled 2022-07-02: qty 2

## 2022-07-02 NOTE — Care Plan (Signed)
Case and imaging have been reviewed with Dr Lowella Dandy. At this time, right renal abscess is too small to safely aspirate or place drain. Recommendation is to continue IV antibiotics and to re-image with CT Abdomen/Pelvis with contrast as inpatient in 3 days if patient does not improve clinically. If patient is discharged before this time, recommend outpatient CT imaging in 2 weeks time. Please contact IR team with any questions or concerns.  Kennieth Francois, PA-C 07/02/2022 9:13 AM

## 2022-07-02 NOTE — Consult Note (Signed)
Subjective: 1. Pyelonephritis   2. Phlegmon      Consult requested by Dr. Burnadette Pop.  Heather Hess is a 62 yo female who is admitted on 06/29/22 with fever, chills, severe right flank pain and malodorous urine that began over about 4 days prior to admission.  She was found on CT on 06/29/22 to have right pyelonephritis.  A follow up CT on 07/01/22 showed a possible small abscess and IR has been consulted but I don't thi.  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   Her blood and urine cultures are negative to date but her UA had many bacteria and WBC.  She has a history of recurrent UTI's and has had pyelo in the past.   ROS:  Review of Systems  Genitourinary:  Positive for flank pain.  All other systems reviewed and are negative.   No Known Allergies  Past Medical History:  Diagnosis Date   Acute kidney injury (HCC)    Alcohol abuse    Anxiety    Bipolar 1 disorder (HCC)    Chronic back pain    Chronic thoracic back pain    Depression    Herniated cervical disc    Opiate abuse, episodic (HCC)    Septicemia (HCC)     Past Surgical History:  Procedure Laterality Date   ABDOMINAL HYSTERECTOMY     HEMORRHOID SURGERY     LAPAROSCOPIC CHOLECYSTECTOMY     TONSILLECTOMY     TUBAL LIGATION      Social History   Socioeconomic History   Marital status: Widowed    Spouse name: Not on file   Number of children: Not on file   Years of education: Not on file   Highest education level: Not on file  Occupational History   Not on file  Tobacco Use   Smoking status: Former    Packs/day: 0.50    Years: 30.00    Additional pack years: 0.00    Total pack years: 15.00    Types: Cigarettes    Quit date: 05/02/2015    Years since quitting: 7.1   Smokeless tobacco: Never  Vaping Use   Vaping Use: Every day  Substance and Sexual Activity   Alcohol use: Yes    Comment: 08/31/2017 "h/o alcohol  abuse; haven't had anything in ~ 2 yrs"   Drug use: No   Sexual activity: Yes    Birth control/protection: Condom  Other Topics Concern   Not on file  Social History Narrative   Not on file   Social Determinants of Health   Financial Resource Strain: Not on file  Food Insecurity: No Food Insecurity (06/29/2022)   Hunger Vital Sign    Worried About Running Out of Food in the Last Year: Never true    Ran Out of Food in the Last Year: Never true  Transportation Needs: No Transportation Needs (06/29/2022)   PRAPARE - Administrator, Civil Service (Medical): No    Lack of Transportation (Non-Medical): No  Physical Activity: Not on file  Stress: Not on file  Social Connections: Not on file  Intimate Partner Violence: Not At Risk (06/29/2022)   Humiliation, Afraid, Rape, and Kick questionnaire    Fear of Current or Ex-Partner: No    Emotionally Abused: No    Physically Abused: No    Sexually Abused: No    History reviewed. No pertinent family  history.  Anti-infectives: Anti-infectives (From admission, onward)    Start     Dose/Rate Route Frequency Ordered Stop   06/29/22 1300  meropenem (MERREM) 1 g in sodium chloride 0.9 % 100 mL IVPB        1 g 200 mL/hr over 30 Minutes Intravenous Every 8 hours 06/29/22 1220     06/29/22 1000  cefTRIAXone (ROCEPHIN) 1 g in sodium chloride 0.9 % 100 mL IVPB        1 g 200 mL/hr over 30 Minutes Intravenous  Once 06/29/22 0954 06/29/22 1054       Current Facility-Administered Medications  Medication Dose Route Frequency Provider Last Rate Last Admin   acetaminophen (TYLENOL) tablet 650 mg  650 mg Oral Q6H PRN Leatha Gilding, MD       Or   acetaminophen (TYLENOL) suppository 650 mg  650 mg Rectal Q6H PRN Leatha Gilding, MD       DULoxetine (CYMBALTA) DR capsule 60 mg  60 mg Oral BID Leatha Gilding, MD   60 mg at 07/02/22 0910   enoxaparin (LOVENOX) injection 40 mg  40 mg Subcutaneous Q24H Leatha Gilding, MD   40 mg at  07/01/22 2142   HYDROcodone-acetaminophen (NORCO/VICODIN) 5-325 MG per tablet 1-2 tablet  1-2 tablet Oral Q4H PRN Leatha Gilding, MD   2 tablet at 07/02/22 0654   meropenem (MERREM) 1 g in sodium chloride 0.9 % 100 mL IVPB  1 g Intravenous Q8H Rumbarger, Rachel L, RPH 200 mL/hr at 07/02/22 0500 Infusion Verify at 07/02/22 0500   ondansetron (ZOFRAN) tablet 4 mg  4 mg Oral Q6H PRN Leatha Gilding, MD       Or   ondansetron (ZOFRAN) injection 4 mg  4 mg Intravenous Q6H PRN Leatha Gilding, MD       senna-docusate (Senokot-S) tablet 1 tablet  1 tablet Oral QHS PRN Leatha Gilding, MD   1 tablet at 07/01/22 2142     Objective: Vital signs in last 24 hours: BP 118/67 (BP Location: Left Arm)   Pulse 60   Temp 97.9 F (36.6 C)   Resp 19   Ht 5\' 8"  (1.727 m)   Wt 59 kg   SpO2 99%   BMI 19.77 kg/m   Intake/Output from previous day: 05/03 0701 - 05/04 0700 In: 1510.8 [P.O.:600; I.V.:675.2; IV Piggyback:235.6] Out: 800 [Urine:800] Intake/Output this shift: No intake/output data recorded.   Physical Exam Vitals reviewed.  Constitutional:      Appearance: Normal appearance.  Cardiovascular:     Rate and Rhythm: Normal rate and regular rhythm.  Pulmonary:     Effort: Pulmonary effort is normal. No respiratory distress.  Abdominal:     General: Abdomen is flat.     Palpations: Abdomen is soft.     Tenderness: There is abdominal tenderness (RUQ). There is right CVA tenderness.  Musculoskeletal:        General: Normal range of motion.  Skin:    General: Skin is warm and dry.  Neurological:     General: No focal deficit present.     Mental Status: She is alert and oriented to person, place, and time.  Psychiatric:        Mood and Affect: Mood normal.        Behavior: Behavior normal.     Lab Results:  Results for orders placed or performed during the hospital encounter of 06/29/22 (from the past 24 hour(s))  CBC  Status: Abnormal   Collection Time: 07/02/22  5:16  AM  Result Value Ref Range   WBC 7.3 4.0 - 10.5 K/uL   RBC 3.39 (L) 3.87 - 5.11 MIL/uL   Hemoglobin 10.3 (L) 12.0 - 15.0 g/dL   HCT 78.4 (L) 69.6 - 29.5 %   MCV 93.8 80.0 - 100.0 fL   MCH 30.4 26.0 - 34.0 pg   MCHC 32.4 30.0 - 36.0 g/dL   RDW 28.4 13.2 - 44.0 %   Platelets 117 (L) 150 - 400 K/uL   nRBC 0.0 0.0 - 0.2 %  Basic metabolic panel     Status: Abnormal   Collection Time: 07/02/22  5:16 AM  Result Value Ref Range   Sodium 139 135 - 145 mmol/L   Potassium 3.3 (L) 3.5 - 5.1 mmol/L   Chloride 105 98 - 111 mmol/L   CO2 27 22 - 32 mmol/L   Glucose, Bld 118 (H) 70 - 99 mg/dL   BUN 7 6 - 20 mg/dL   Creatinine, Ser 1.02 0.44 - 1.00 mg/dL   Calcium 7.7 (L) 8.9 - 10.3 mg/dL   GFR, Estimated >72 >53 mL/min   Anion gap 7 5 - 15    BMET Recent Labs    07/01/22 0424 07/02/22 0516  NA 134* 139  K 3.9 3.3*  CL 104 105  CO2 25 27  GLUCOSE 114* 118*  BUN 11 7  CREATININE 0.74 0.64  CALCIUM 7.5* 7.7*   PT/INR No results for input(s): "LABPROT", "INR" in the last 72 hours. ABG No results for input(s): "PHART", "HCO3" in the last 72 hours.  Invalid input(s): "PCO2", "PO2"  Studies/Results: CT ABDOMEN PELVIS W CONTRAST  Result Date: 07/01/2022 CLINICAL DATA:  Kidney infection. Patient has been on antibiotics with some improvement. Still with flank pain. EXAM: CT ABDOMEN AND PELVIS WITH CONTRAST TECHNIQUE: Multidetector CT imaging of the abdomen and pelvis was performed using the standard protocol following bolus administration of intravenous contrast. RADIATION DOSE REDUCTION: This exam was performed according to the departmental dose-optimization program which includes automated exposure control, adjustment of the mA and/or kV according to patient size and/or use of iterative reconstruction technique. CONTRAST:  80mL OMNIPAQUE IOHEXOL 300 MG/ML  SOLN COMPARISON:  06/29/2022. FINDINGS: Lower chest: Trace pleural effusions. Mild dependent lower lobe atelectasis. Hepatobiliary: Liver  mildly enlarged, 24 cm transversely. Normal liver attenuation. No mass. Status post cholecystectomy. Mild intrahepatic bile duct dilation. Common bile duct measures 8 mm proximally, with distal tapering. Pancreas: Unremarkable. No pancreatic ductal dilatation or surrounding inflammatory changes. Spleen: Normal in size without focal abnormality. Adrenals/Urinary Tract: No adrenal mass. There are areas of decreased attenuation noted in the right kidney, largest extending from the mid to the upper pole, smaller areas in the lower pole. These findings are without significant change from the prior CT. There is a well-defined hypoattenuating mass in the right kidney at the midpole, 1.9 cm in size, consistent with a cyst with no follow-up rectum. Small area of fluid attenuation noted in the upper pole the right kidney, new since the prior exam, 1.2 cm in size, suspected to be a small renal abscess. No other evidence of a renal abscess. Mild right perinephric stranding/edema extending along the posterior pararenal fascia into the pelvis. No perinephric abscess. Normal appearance of the left kidney. No renal stones on either side. Mild dilation of the right renal pelvis and portions of the right ureter. Normal left renal collecting system and ureter. Bladder unremarkable. Stomach/Bowel: Normal stomach. Small  bowel and colon are normal in caliber. No wall thickening. No inflammation. Moderate increase in the colonic stool burden. Vascular/Lymphatic: Aortic atherosclerosis. No aneurysm. No enlarged lymph nodes. Reproductive: Status post hysterectomy. No adnexal masses. Other: Trace ascites. Musculoskeletal: No fracture or acute finding. No aggressive bone lesion. IMPRESSION: 1. Multifocal right sided pyelonephritis. Small, 1.2 cm, renal abscess suspected from the lateral upper pole the right kidney. This is new since the prior exam. Overall extent of the pyelonephritis, however, is unchanged. No perinephric abscess. 2. No  other acute abnormality. Normal appearance of the left kidney. 3. Moderate increase in the colonic stool burden, similar to the prior CT. 4. Aortic atherosclerosis. Electronically Signed   By: Amie Portland M.D.   On: 07/01/2022 16:45     Assessment/Plan: Right pyelonephritis with possible small abscess.  She is clinically improving on IV antibiotics.  Could be switched to oral antibiotics for at least a 2 week course and then f/u in our office which I will arrange.         No follow-ups on file.    CC: Dr. Burnadette Pop.      Bjorn Pippin 07/02/2022

## 2022-07-03 LAB — CULTURE, BLOOD (ROUTINE X 2)
Culture: NO GROWTH
Special Requests: ADEQUATE

## 2022-07-04 LAB — CULTURE, BLOOD (ROUTINE X 2): Special Requests: ADEQUATE

## 2022-07-10 ENCOUNTER — Emergency Department (HOSPITAL_BASED_OUTPATIENT_CLINIC_OR_DEPARTMENT_OTHER): Payer: No Typology Code available for payment source

## 2022-07-10 ENCOUNTER — Emergency Department (HOSPITAL_BASED_OUTPATIENT_CLINIC_OR_DEPARTMENT_OTHER)
Admission: EM | Admit: 2022-07-10 | Discharge: 2022-07-10 | Disposition: A | Discharge status: Home / Self Care | Payer: No Typology Code available for payment source | Attending: Emergency Medicine | Admitting: Emergency Medicine

## 2022-07-10 ENCOUNTER — Encounter (HOSPITAL_BASED_OUTPATIENT_CLINIC_OR_DEPARTMENT_OTHER): Payer: Self-pay

## 2022-07-10 ENCOUNTER — Other Ambulatory Visit: Payer: Self-pay

## 2022-07-10 DIAGNOSIS — K5900 Constipation, unspecified: Secondary | ICD-10-CM | POA: Insufficient documentation

## 2022-07-10 DIAGNOSIS — K567 Ileus, unspecified: Secondary | ICD-10-CM | POA: Diagnosis not present

## 2022-07-10 DIAGNOSIS — R109 Unspecified abdominal pain: Secondary | ICD-10-CM | POA: Diagnosis present

## 2022-07-10 LAB — CBC WITH DIFFERENTIAL/PLATELET
Abs Immature Granulocytes: 0.08 10*3/uL — ABNORMAL HIGH (ref 0.00–0.07)
Basophils Absolute: 0 10*3/uL (ref 0.0–0.1)
Basophils Relative: 1 %
Eosinophils Absolute: 0.1 10*3/uL (ref 0.0–0.5)
Eosinophils Relative: 1 %
HCT: 39.7 % (ref 36.0–46.0)
Hemoglobin: 13 g/dL (ref 12.0–15.0)
Immature Granulocytes: 1 %
Lymphocytes Relative: 31 %
Lymphs Abs: 1.8 10*3/uL (ref 0.7–4.0)
MCH: 30.5 pg (ref 26.0–34.0)
MCHC: 32.7 g/dL (ref 30.0–36.0)
MCV: 93.2 fL (ref 80.0–100.0)
Monocytes Absolute: 0.4 10*3/uL (ref 0.1–1.0)
Monocytes Relative: 8 %
Neutro Abs: 3.4 10*3/uL (ref 1.7–7.7)
Neutrophils Relative %: 58 %
Platelets: 465 10*3/uL — ABNORMAL HIGH (ref 150–400)
RBC: 4.26 MIL/uL (ref 3.87–5.11)
RDW: 12.6 % (ref 11.5–15.5)
WBC: 5.8 10*3/uL (ref 4.0–10.5)
nRBC: 0 % (ref 0.0–0.2)

## 2022-07-10 LAB — COMPREHENSIVE METABOLIC PANEL
ALT: 20 U/L (ref 0–44)
AST: 14 U/L — ABNORMAL LOW (ref 15–41)
Albumin: 3.9 g/dL (ref 3.5–5.0)
Alkaline Phosphatase: 78 U/L (ref 38–126)
Anion gap: 11 (ref 5–15)
BUN: 18 mg/dL (ref 6–20)
CO2: 28 mmol/L (ref 22–32)
Calcium: 8.8 mg/dL — ABNORMAL LOW (ref 8.9–10.3)
Chloride: 98 mmol/L (ref 98–111)
Creatinine, Ser: 0.87 mg/dL (ref 0.44–1.00)
GFR, Estimated: >60 mL/min (ref >60)
Glucose, Bld: 96 mg/dL (ref 70–99)
Potassium: 4.1 mmol/L (ref 3.5–5.1)
Sodium: 137 mmol/L (ref 135–145)
Total Bilirubin: 0.4 mg/dL (ref 0.3–1.2)
Total Protein: 6.9 g/dL (ref 6.5–8.1)

## 2022-07-10 LAB — URINALYSIS, ROUTINE W REFLEX MICROSCOPIC
Bilirubin Urine: NEGATIVE
Glucose, UA: NEGATIVE mg/dL
Hgb urine dipstick: NEGATIVE
Ketones, ur: NEGATIVE mg/dL
Leukocytes,Ua: NEGATIVE
Nitrite: NEGATIVE
Protein, ur: NEGATIVE mg/dL
Specific Gravity, Urine: 1.016 (ref 1.005–1.030)
pH: 6.5 (ref 5.0–8.0)

## 2022-07-10 MED ORDER — IOHEXOL 300 MG/ML  SOLN
100.0000 mL | Freq: Once | INTRAMUSCULAR | Status: AC | PRN
  Administered 2022-07-10: 80 mL via INTRAVENOUS

## 2022-07-10 NOTE — Discharge Instructions (Signed)
Home to rest. Can try and treat constipation at home. Return to the ER for any worsening or concerning symptoms, if not improving at home may need admission to the hospital.  Recheck with your PCP.

## 2022-07-10 NOTE — ED Triage Notes (Addendum)
He reports right flank pain and some dysuria. She cites recent hospitalization for ?pyelo? And is still on antibiotic for U.T.I. she states her u.t.I. sx improved; but seem to "be getting worse again". She also mentions a few areas of "painful spots" on bilat. Hands.

## 2022-07-10 NOTE — ED Provider Notes (Signed)
[place] EMERGENCY DEPARTMENT AT [facility] Provider Note   CSN: [id number] Arrival date & time: 07/10/22  1154     History  Chief Complaint  Patient presents with   Flank Pain    Heather Hess is a 62 y.o. female.  62 year old female presents the emergency room with concern for right flank pain with dysuria, dark urine, positive home test for UTI.  Patient was admitted to the hospital, discharged 1 week ago on Cipro.  Initially admitted for urinary tract infection with pyelonephritis, developed renal abscess.  Ultimately, her urine culture and blood cultures were negative, her fever and white count improved and she was discharged.  Denies fevers or vomiting today, states that she did not want to wait as long as she waited last time. Last bowel movement this morning, notes smaller than usual bowel movement this morning.        Home Medications Prior to Admission medications   Medication Sig Start Date End Date Taking? Authorizing Provider  Ascorbic Acid (VITAMIN C PO) Take 1 tablet by mouth at bedtime.    [provider]  ciprofloxacin (CIPRO) 500 MG tablet Take 1 tablet (500 mg total) by mouth 2 (two) times daily for 10 days. 07/02/22 07/12/22  Burnadette Pop, MD  DULoxetine (CYMBALTA) 60 MG capsule Take 1 capsule (60 mg total) by mouth 2 (two) times daily. 02/01/16   Adonis Brook, NP  HYDROcodone-acetaminophen (NORCO/VICODIN) 5-325 MG tablet Take 1-2 tablets by mouth every 4 (four) hours as needed for moderate pain. 07/02/22   Burnadette Pop, MD  potassium chloride SA (KLOR-CON M) 20 MEQ tablet Take 2 tablets (40 mEq total) by mouth daily for 3 days. 07/03/22 07/06/22  Burnadette Pop, MD      Allergies    Patient has no known allergies.    Review of Systems   Review of Systems Negative except as per HPI Physical Exam Updated Vital Signs BP (!) 131/115   Pulse 73   Temp 98.3 F (36.8 C) (Oral)   Resp 16   SpO2 98%  Physical Exam Vitals and nursing  note reviewed.  Constitutional:      General: She is not in acute distress.    Appearance: She is well-developed. She is not diaphoretic.  HENT:     Head: Normocephalic and atraumatic.  Pulmonary:     Effort: Pulmonary effort is normal.  Abdominal:     Palpations: Abdomen is soft.     Tenderness: There is abdominal tenderness in the right upper quadrant and right lower quadrant. There is no right CVA tenderness or left CVA tenderness.     Comments: Mild right-sided abdominal pain without guarding or rebound  Skin:    General: Skin is warm and dry.     Findings: No erythema or rash.  Neurological:     Mental Status: She is alert and oriented to person, place, and time.  Psychiatric:        Behavior: Behavior normal.     ED Results / Procedures / Treatments   Labs (all labs ordered are listed, but only abnormal results are displayed) Labs Reviewed  COMPREHENSIVE METABOLIC PANEL - Abnormal; Notable for the following components:      Result Value   Calcium 8.8 (*)    AST 14 (*)    All other components within normal limits  CBC WITH DIFFERENTIAL/PLATELET - Abnormal; Notable for the following components:   Platelets 465 (*)    Abs Immature Granulocytes 0.08 (*)    All  other components within normal limits  URINALYSIS, ROUTINE W REFLEX MICROSCOPIC    EKG None  Radiology CT ABDOMEN PELVIS W CONTRAST  Result Date: 07/10/2022 CLINICAL DATA:  Right flank pain and dysuria. EXAM: CT ABDOMEN AND PELVIS WITH CONTRAST TECHNIQUE: Multidetector CT imaging of the abdomen and pelvis was performed using the standard protocol following bolus administration of intravenous contrast. RADIATION DOSE REDUCTION: This exam was performed according to the departmental dose-optimization program which includes automated exposure control, adjustment of the mA and/or kV according to patient size and/or use of iterative reconstruction technique. CONTRAST:  80mL OMNIPAQUE IOHEXOL 300 MG/ML  SOLN COMPARISON:   07/01/2022 FINDINGS: Lower chest: Unremarkable. Hepatobiliary: No suspicious focal abnormality within the liver parenchyma. Mild intrahepatic biliary duct prominence is similar. Common bile duct is nondilated proximal to the ampulla. Gallbladder surgically absent. Pancreas: No focal mass lesion. No dilatation of the main duct. No intraparenchymal cyst. No peripancreatic edema. Spleen: No splenomegaly. No focal mass lesion. Adrenals/Urinary Tract: No adrenal nodule or mass. 2.3 cm simple cyst noted upper pole right kidney. Interval improvement in the post infectious/inflammatory changes noted previously in the upper pole right kidney and lower interpolar region. Right kidney perfusion is now more homogeneous. No evidence for intrarenal abscess or phlegmon in the right kidney . Left kidney unremarkable. No evidence for hydroureter. Bladder is distended. Stomach/Bowel: Stomach is unremarkable. No gastric wall thickening. No evidence of outlet obstruction. Duodenum is normally positioned as is the ligament of Treitz. Duodenum remains patulous in appearance. Small bowel is diffusely fluid-filled and mildly distended. The terminal ileum is normal. The appendix is normal. No gross colonic mass. No colonic wall thickening. Large stool volume evident. Vascular/Lymphatic: There is mild atherosclerotic calcification of the abdominal aorta without aneurysm. There is no gastrohepatic or hepatoduodenal ligament lymphadenopathy. No retroperitoneal or mesenteric lymphadenopathy. No pelvic sidewall lymphadenopathy. Reproductive: Hysterectomy.  There is no adnexal mass. Other: No substantial intraperitoneal free fluid. Musculoskeletal: No worrisome lytic or sclerotic osseous abnormality. IMPRESSION: 1. Interval improvement in the post infectious/inflammatory changes noted previously in the upper pole right kidney and lower interpolar region. Right kidney perfusion is now more homogeneous. No evidence for intrarenal abscess or  phlegmon in the right kidney. 2. Diffusely fluid-filled and mildly distended small bowel, suggesting ileus. 3. Large stool volume. Imaging features could be compatible with clinical constipation. 4.  Aortic Atherosclerosis (ICD10-I70.0). Electronically Signed   By: Kennith Center M.D.   On: 07/10/2022 14:43   US Renal  Result Date: 07/10/2022 CLINICAL DATA:  Right lower abdominal pain. Malodorous urine. History of recent pyelonephritis. EXAM: RENAL / URINARY TRACT ULTRASOUND COMPLETE COMPARISON:  CT of the abdomen and pelvis with contrast Jul 01, 2022 FINDINGS: Right Kidney: Renal measurements: 11.3 x 4.1 x 4.5 cm = volume: 108.4 mL. Mild hydronephrosis. Two complex rounded regions in the midpole measuring 2.6 and 1.6 cm without internal blood flow. Left Kidney: Renal measurements: 11 x 4.8 x 4.4 cm = volume: 122.4 mL. Echogenicity within normal limits. No mass or hydronephrosis visualized. Bladder: Appears normal for degree of bladder distention. Other: None. IMPRESSION: The 2 complex regions in the right kidney demonstrate no internal blood flow. However, given history and recent CT imaging, abscesses are not excluded. CT imaging with contrast may better evaluate. Electronically Signed   By: Gerome Sam III M.D.   On: 07/10/2022 13:42    Procedures Procedures    Medications Ordered in ED Medications  iohexol (OMNIPAQUE) 300 MG/ML solution 100 mL (80 mLs Intravenous Contrast Given 07/10/22  1359)    ED Course/ Medical Decision Making/ A&P                             Medical Decision Making Amount and/or Complexity of Data Reviewed Labs: ordered. Radiology: ordered.  Risk Prescription drug management.   This patient presents to the ED for concern of right side abdominal pain, this involves an extensive number of treatment options, and is a complaint that carries with it a high risk of complications and morbidity.  The differential diagnosis includes but not limited to urinary tract  infection, pyelonephritis, kidney stone   Co morbidities that complicate the patient evaluation  Recent admission for pyelonephritis, abscess to the kidney, depression, chronic pain, AKI, bipolar disorder   Additional history obtained:  Additional history obtained from spouse at bedside who contributes to history as above External records from outside source obtained and reviewed including discharge summary dated 07/02/2022.  Review of recent labs including negative blood cultures, negative urine culture.  Recent imaging with concern for small abscess to the right kidney not felt to be drainable by IR, treated with antibiotics.   Lab Tests:  I Ordered, and personally interpreted labs.  The pertinent results include: CBC with normal WBC.  CMP with normal renal function.  Urinalysis is unremarkable.   Imaging Studies ordered:  I ordered imaging studies including CT abdomen pelvis I independently visualized and interpreted imaging which showed constipation, ileus I agree with the radiologist interpretation Renal ultrasound ordered, recommends CT for further evaluation of right renal cyst versus abscess   Consultations Obtained:  I requested consultation with the ER attending, Dr. Adela Lank,  and discussed lab and imaging findings as well as pertinent plan - they recommend: Shared decision making with patient, offered admission versus home to trial treatment for constipation with return precautions   Problem List / ED Course / Critical interventions / Medication management  62 year old female presents with complaint of pain on the right side of her abdomen.  Recently admitted to the hospital for pyelonephritis with concern for abscess to the right kidney, was concerned that her dysuria, dark urine and right side abdominal pain or symptoms of worsening abscess in the kidney and return to the ER.  She is afebrile, vitals reviewed and reassuring, labs without significant findings.  Renal  ultrasound is indeterminant, is followed with a CT abdomen pelvis with contrast which shows improvement in the right pyelonephritis however suggest ileus constipation.  Discussed results with ER attending as above.  Discussed results with patient and spouse, offered admission, patient declines would prefer to trial home management of her constipation, will return to ER for worsening or concerning symptoms. I have reviewed the patients home medicines and have made adjustments as needed   Social Determinants of Health:  Lives with family, has PCP   Test / Admission - Considered:  Offered admission for her constipation/ileus.  Patient declines, would prefer to try home treatment, agrees to return for any worsening or concerning symptoms.         Final Clinical Impression(s) / ED Diagnoses Final diagnoses:  Constipation, unspecified constipation type  Ileus [facility])    Rx / DC Orders ED Discharge Orders     None         Jeannie Fend, PA-C 07/10/22 1625    Melene Plan, DO 07/11/22 [phone]
